# Patient Record
Sex: Male | Born: 1970 | Race: White | Hispanic: No | Smoking: Current every day smoker
Health system: Southern US, Community
[De-identification: ages and names within clinical notes are randomized; demographics above are authoritative.]

## PROBLEM LIST (undated history)

## (undated) DIAGNOSIS — J449 Chronic obstructive pulmonary disease, unspecified: Secondary | ICD-10-CM

## (undated) DIAGNOSIS — I10 Essential (primary) hypertension: Secondary | ICD-10-CM

## (undated) DIAGNOSIS — N200 Calculus of kidney: Secondary | ICD-10-CM

## (undated) DIAGNOSIS — J9383 Other pneumothorax: Secondary | ICD-10-CM

## (undated) HISTORY — PX: LITHOTRIPSY: SUR834

## (undated) HISTORY — PX: LUNG SURGERY: SHX703

## (undated) HISTORY — PX: CYSTOSCOPY: SUR368

## (undated) HISTORY — PX: CHEST TUBE INSERTION: SHX231

---

## 2007-05-31 ENCOUNTER — Emergency Department (HOSPITAL_COMMUNITY): Admission: EM | Admit: 2007-05-31 | Discharge: 2007-06-01 | Payer: Self-pay | Admitting: Emergency Medicine

## 2007-08-06 ENCOUNTER — Emergency Department (HOSPITAL_COMMUNITY): Admission: EM | Admit: 2007-08-06 | Discharge: 2007-08-06 | Payer: Self-pay | Admitting: Emergency Medicine

## 2007-09-25 ENCOUNTER — Emergency Department (HOSPITAL_COMMUNITY): Admission: EM | Admit: 2007-09-25 | Discharge: 2007-09-25 | Payer: Self-pay | Admitting: Emergency Medicine

## 2007-09-27 ENCOUNTER — Emergency Department (HOSPITAL_COMMUNITY): Admission: EM | Admit: 2007-09-27 | Discharge: 2007-09-27 | Payer: Self-pay | Admitting: Emergency Medicine

## 2007-10-06 ENCOUNTER — Emergency Department (HOSPITAL_COMMUNITY): Admission: EM | Admit: 2007-10-06 | Discharge: 2007-10-06 | Payer: Self-pay | Admitting: Emergency Medicine

## 2007-10-21 ENCOUNTER — Emergency Department (HOSPITAL_COMMUNITY): Admission: EM | Admit: 2007-10-21 | Discharge: 2007-10-21 | Payer: Self-pay | Admitting: Emergency Medicine

## 2007-11-16 ENCOUNTER — Emergency Department: Payer: Self-pay | Admitting: Emergency Medicine

## 2007-11-23 ENCOUNTER — Emergency Department: Payer: Self-pay | Admitting: Emergency Medicine

## 2007-11-25 ENCOUNTER — Emergency Department (HOSPITAL_COMMUNITY): Admission: EM | Admit: 2007-11-25 | Discharge: 2007-11-25 | Payer: Self-pay | Admitting: Emergency Medicine

## 2008-01-11 ENCOUNTER — Emergency Department (HOSPITAL_COMMUNITY): Admission: EM | Admit: 2008-01-11 | Discharge: 2008-01-11 | Payer: Self-pay | Admitting: Emergency Medicine

## 2008-07-30 ENCOUNTER — Emergency Department (HOSPITAL_BASED_OUTPATIENT_CLINIC_OR_DEPARTMENT_OTHER): Admission: EM | Admit: 2008-07-30 | Discharge: 2008-07-30 | Payer: Self-pay | Admitting: Emergency Medicine

## 2008-08-23 ENCOUNTER — Ambulatory Visit: Payer: Self-pay | Admitting: Diagnostic Radiology

## 2008-08-23 ENCOUNTER — Emergency Department (HOSPITAL_BASED_OUTPATIENT_CLINIC_OR_DEPARTMENT_OTHER): Admission: EM | Admit: 2008-08-23 | Discharge: 2008-08-23 | Payer: Self-pay | Admitting: Emergency Medicine

## 2008-09-25 ENCOUNTER — Emergency Department (HOSPITAL_BASED_OUTPATIENT_CLINIC_OR_DEPARTMENT_OTHER): Admission: EM | Admit: 2008-09-25 | Discharge: 2008-09-25 | Payer: Self-pay | Admitting: Emergency Medicine

## 2008-11-06 ENCOUNTER — Ambulatory Visit: Payer: Self-pay | Admitting: Diagnostic Radiology

## 2008-11-06 ENCOUNTER — Emergency Department (HOSPITAL_BASED_OUTPATIENT_CLINIC_OR_DEPARTMENT_OTHER): Admission: EM | Admit: 2008-11-06 | Discharge: 2008-11-06 | Payer: Self-pay | Admitting: Emergency Medicine

## 2009-01-04 ENCOUNTER — Emergency Department (HOSPITAL_BASED_OUTPATIENT_CLINIC_OR_DEPARTMENT_OTHER): Admission: EM | Admit: 2009-01-04 | Discharge: 2009-01-04 | Payer: Self-pay | Admitting: Emergency Medicine

## 2009-01-04 ENCOUNTER — Ambulatory Visit: Payer: Self-pay | Admitting: Diagnostic Radiology

## 2009-02-17 ENCOUNTER — Emergency Department (HOSPITAL_BASED_OUTPATIENT_CLINIC_OR_DEPARTMENT_OTHER): Admission: EM | Admit: 2009-02-17 | Discharge: 2009-02-17 | Payer: Self-pay | Admitting: Internal Medicine

## 2009-02-17 ENCOUNTER — Ambulatory Visit: Payer: Self-pay | Admitting: Diagnostic Radiology

## 2009-05-04 ENCOUNTER — Ambulatory Visit: Payer: Self-pay | Admitting: Diagnostic Radiology

## 2009-05-04 ENCOUNTER — Emergency Department (HOSPITAL_BASED_OUTPATIENT_CLINIC_OR_DEPARTMENT_OTHER): Admission: EM | Admit: 2009-05-04 | Discharge: 2009-05-04 | Payer: Self-pay | Admitting: Emergency Medicine

## 2009-07-20 ENCOUNTER — Emergency Department (HOSPITAL_BASED_OUTPATIENT_CLINIC_OR_DEPARTMENT_OTHER): Admission: EM | Admit: 2009-07-20 | Discharge: 2009-07-20 | Payer: Self-pay | Admitting: Emergency Medicine

## 2009-07-20 ENCOUNTER — Ambulatory Visit: Payer: Self-pay | Admitting: Diagnostic Radiology

## 2010-08-11 ENCOUNTER — Emergency Department (INDEPENDENT_AMBULATORY_CARE_PROVIDER_SITE_OTHER)
Admission: EM | Admit: 2010-08-11 | Discharge: 2010-08-11 | Disposition: A | Discharge status: Other Acute Inpatient Hospital | Payer: Self-pay | Source: Home / Self Care | Admitting: Emergency Medicine

## 2010-08-11 ENCOUNTER — Inpatient Hospital Stay (HOSPITAL_COMMUNITY)
Admission: EM | Admit: 2010-08-11 | Discharge: 2010-08-20 | DRG: 165 | Disposition: A | Discharge status: Home / Self Care | Payer: Self-pay | Attending: Cardiothoracic Surgery | Admitting: Cardiothoracic Surgery

## 2010-08-11 DIAGNOSIS — R079 Chest pain, unspecified: Secondary | ICD-10-CM

## 2010-08-11 DIAGNOSIS — M549 Dorsalgia, unspecified: Secondary | ICD-10-CM

## 2010-08-11 DIAGNOSIS — F17200 Nicotine dependence, unspecified, uncomplicated: Secondary | ICD-10-CM | POA: Diagnosis present

## 2010-08-11 DIAGNOSIS — J439 Emphysema, unspecified: Secondary | ICD-10-CM | POA: Diagnosis present

## 2010-08-11 DIAGNOSIS — D72829 Elevated white blood cell count, unspecified: Secondary | ICD-10-CM | POA: Diagnosis present

## 2010-08-11 DIAGNOSIS — J9383 Other pneumothorax: Principal | ICD-10-CM | POA: Diagnosis present

## 2010-08-11 DIAGNOSIS — J9382 Other air leak: Secondary | ICD-10-CM | POA: Diagnosis present

## 2010-08-11 LAB — COMPREHENSIVE METABOLIC PANEL
AST: 16 U/L (ref 0–37)
Albumin: 4.4 g/dL (ref 3.5–5.2)
CO2: 28 mEq/L (ref 19–32)
Calcium: 9.3 mg/dL (ref 8.4–10.5)
Chloride: 108 mEq/L (ref 96–112)
Creatinine, Ser: 0.9 mg/dL (ref 0.4–1.5)
Glucose, Bld: 85 mg/dL (ref 70–99)
Potassium: 3.6 mEq/L (ref 3.5–5.1)

## 2010-08-11 LAB — DIFFERENTIAL
Basophils Absolute: 0.1 10*3/uL (ref 0.0–0.1)
Eosinophils Relative: 3 % (ref 0–5)
Lymphocytes Relative: 22 % (ref 12–46)
Lymphs Abs: 4.1 10*3/uL — ABNORMAL HIGH (ref 0.7–4.0)
Monocytes Absolute: 1.1 10*3/uL — ABNORMAL HIGH (ref 0.1–1.0)
Neutro Abs: 12.8 10*3/uL — ABNORMAL HIGH (ref 1.7–7.7)
Neutrophils Relative %: 68 % (ref 43–77)

## 2010-08-11 LAB — APTT: aPTT: 29 seconds (ref 24–37)

## 2010-08-11 LAB — PROTIME-INR
INR: 1 (ref 0.00–1.49)
Prothrombin Time: 13.4 seconds (ref 11.6–15.2)

## 2010-08-11 LAB — POCT I-STAT 3, ART BLOOD GAS (G3+)
Acid-Base Excess: 1 mmol/L (ref 0.0–2.0)
O2 Saturation: 98 %
TCO2: 29 mmol/L (ref 0–100)

## 2010-08-11 LAB — CBC
HCT: 41.3 % (ref 39.0–52.0)
MCHC: 34.4 g/dL (ref 30.0–36.0)
WBC: 18.7 10*3/uL — ABNORMAL HIGH (ref 4.0–10.5)

## 2010-08-11 LAB — D-DIMER, QUANTITATIVE: D-Dimer, Quant: 0.53 ug/mL-FEU — ABNORMAL HIGH (ref 0.00–0.48)

## 2010-08-13 LAB — CBC
HCT: 39.5 % (ref 39.0–52.0)
Hemoglobin: 12.6 g/dL — ABNORMAL LOW (ref 13.0–17.0)
MCH: 29 pg (ref 26.0–34.0)
MCHC: 31.9 g/dL (ref 30.0–36.0)
MCV: 91 fL (ref 78.0–100.0)
Platelets: 250 10*3/uL (ref 150–400)
RBC: 4.34 MIL/uL (ref 4.22–5.81)
RDW: 13.1 % (ref 11.5–15.5)
WBC: 7.4 10*3/uL (ref 4.0–10.5)

## 2010-08-13 LAB — URINE CULTURE
Colony Count: NO GROWTH
Culture  Setup Time: 201201291714
Culture: NO GROWTH

## 2010-08-13 NOTE — H&P (Signed)
[name], [name]              ACCOUNT NO.:  [id number]  MEDICAL RECORD NO.:  [id number]          PATIENT TYPE:  INP  LOCATION:  2038                         FACILITY:  [facility]  PHYSICIAN:  Kerin Perna, M.D.  DATE OF BIRTH:  09-Apr-1971  DATE OF ADMISSION:  08/11/2010 DATE OF DISCHARGE:                             HISTORY & PHYSICAL   ADMISSION DIAGNOSIS:  Spontaneous left pneumothorax.  CHIEF COMPLAINT:  Shortness of breath, chest and back pain.  HISTORY OF PRESENT ILLNESS:  The patient is a 40 year old male, Caucasian, smoker, who presented to the emergency room with his first episode of spontaneous pneumothorax.  There is no history of trauma, violent cough, or emesis.  The patient has had symptoms of back pain and shortness of breath for approximately 12 hours and presented to the [facility] where a chest x-ray showed a 40% left pneumothorax.  He had evidence of COPD changes as well and a small right less than 10% apical pneumothorax as well.  A 28-French chest tube was placed with re-expansion of the lung with a residual air leak.  The patient denies a productive cough, fever, or recent pneumonia.  He smokes a pack and half of cigarettes a day.  There is no family history of spontaneous pneumothorax.  He has had no previous treatment for spontaneous pneumothorax.  PAST MEDICAL HISTORY: 1. Kidney stones with previous UTI, previous lithotripsy by Dr.     Merry Lofty in [facility] 2. Diverticulosis. 3. COPD, active smoking. 4. Allergic reaction to TORADOL.  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  He is married.  He smokes pack and a half cigarettes a day without significant alcohol intake.  FAMILY HISTORY:  Noncontributory.  REVIEW OF SYSTEMS:  No fever or weight loss.  General strength and energy level have been decreased over the past several weeks.  No orthopnea, PND, ankle edema,  angina, arrhythmia.  No history of cardiac murmur.  GI is significant for previous diverticular disease noted on previous CT scans of the abdomen. No active bowel complaints.  Vascular review is negative for DVT, TIA, or claudication.  Hematologic review is negative for bleeding disorders, blood transfusions.  Endocrine review is negative for diabetes.  Neurologic review is negative for stroke or seizure.  CT scan of the head in 2010 was negative.  PHYSICAL EXAMINATION:  VITAL SIGNS:  The patient is 5 feet 7 inches, weighs 128 pounds.  Blood pressure in the emergency room is 170/110, pulse 108, pulse oxygen saturation 100%, temperature 97.6. GENERAL APPEARANCE:  A middle-aged Caucasian male, uncomfortable, in no acute distress. HEENT:  Normocephalic.  Pupils equal. NECK:  Without crepitus or JVD. THORAX:  Without deformity.  There is a left chest tube in place and secured with an air leak to the under water seal Pleur-evac system. Breath sounds are diminished bilaterally. CARDIAC:  Regular rhythm without gallop or murmur. ABDOMEN:  Soft, nontender. EXTREMITIES:  Mild clubbing.  No cyanosis, edema, or tenderness. Peripheral pulses are intact. NEUROLOGIC:  Intact.  LABORATORY DATA:  His white count is elevated  at 18,000, hematocrit 41. BUN, creatinine normal.  LFTs normal.  Albumin 4.4.  Urinalysis is pending.  His EKG shows sinus tachycardia, and his chest x-ray is noted as above.  IMPRESSION AND PLAN:  First episode of spontaneous left pneumothorax in a heavy smoker.  The patient has an elevated white count and a history of kidney stones.  He possibly could have stress leukocytosis from his pneumothorax.  We will plan on obtaining a chest CT scan with contrast, urine and culture and admit the patient for observation.     Kerin Perna, M.D.     PV/MEDQ  D:  08/11/2010  T:  08/12/2010  Job:  [id number]  Electronically Signed by Kerin Perna M.D. on 08/13/2010 05:21:57  PM

## 2010-08-15 ENCOUNTER — Inpatient Hospital Stay (HOSPITAL_COMMUNITY): Payer: Self-pay

## 2010-08-15 LAB — ABO/RH: ABO/RH(D): A NEG

## 2010-08-15 LAB — TYPE AND SCREEN
ABO/RH(D): A NEG
Antibody Screen: NEGATIVE

## 2010-08-16 ENCOUNTER — Inpatient Hospital Stay (HOSPITAL_COMMUNITY): Payer: Self-pay

## 2010-08-16 ENCOUNTER — Other Ambulatory Visit: Payer: Self-pay | Admitting: Cardiothoracic Surgery

## 2010-08-16 DIAGNOSIS — J930 Spontaneous tension pneumothorax: Secondary | ICD-10-CM

## 2010-08-16 LAB — BLOOD GAS, ARTERIAL
Acid-Base Excess: 6.2 mmol/L — ABNORMAL HIGH (ref 0.0–2.0)
Acid-Base Excess: 7.6 mmol/L — ABNORMAL HIGH (ref 0.0–2.0)
Bicarbonate: 32.4 mEq/L — ABNORMAL HIGH (ref 20.0–24.0)
Bicarbonate: 33.6 mEq/L — ABNORMAL HIGH (ref 20.0–24.0)
Drawn by: 249101
Drawn by: 249101
O2 Content: 4 L/min
O2 Content: 4 L/min
O2 Saturation: 97.5 %
O2 Saturation: 99.1 %
Patient temperature: 97.7
Patient temperature: 98.7
TCO2: 34.5 mmol/L (ref 0–100)
TCO2: 35.6 mmol/L (ref 0–100)
pCO2 arterial: 67.3 mmHg (ref 35.0–45.0)
pCO2 arterial: 66.7 mmHg (ref 35.0–45.0)
pH, Arterial: 7.301 — ABNORMAL LOW (ref 7.350–7.450)
pH, Arterial: 7.323 — ABNORMAL LOW (ref 7.350–7.450)
pO2, Arterial: 104 mmHg — ABNORMAL HIGH (ref 80.0–100.0)
pO2, Arterial: 153 mmHg — ABNORMAL HIGH (ref 80.0–100.0)

## 2010-08-16 LAB — POCT I-STAT 3, ART BLOOD GAS (G3+)
Acid-Base Excess: 5 mmol/L — ABNORMAL HIGH (ref 0.0–2.0)
Bicarbonate: 31.7 mEq/L — ABNORMAL HIGH (ref 20.0–24.0)
O2 Saturation: 100 %
TCO2: 33 mmol/L (ref 0–100)
pCO2 arterial: 53.5 mmHg — ABNORMAL HIGH (ref 35.0–45.0)
pH, Arterial: 7.381 (ref 7.350–7.450)
pO2, Arterial: 200 mmHg — ABNORMAL HIGH (ref 80.0–100.0)

## 2010-08-16 LAB — MRSA PCR SCREENING: MRSA by PCR: NEGATIVE

## 2010-08-17 ENCOUNTER — Inpatient Hospital Stay (HOSPITAL_COMMUNITY): Payer: Self-pay

## 2010-08-17 LAB — POCT I-STAT 3, ART BLOOD GAS (G3+)
Acid-Base Excess: 5 mmol/L — ABNORMAL HIGH (ref 0.0–2.0)
Bicarbonate: 31.7 mEq/L — ABNORMAL HIGH (ref 20.0–24.0)
O2 Saturation: 98 %
Patient temperature: 98
TCO2: 33 mmol/L (ref 0–100)
pCO2 arterial: 50.5 mmHg — ABNORMAL HIGH (ref 35.0–45.0)
pH, Arterial: 7.404 (ref 7.350–7.450)
pO2, Arterial: 102 mmHg — ABNORMAL HIGH (ref 80.0–100.0)

## 2010-08-17 LAB — BASIC METABOLIC PANEL
BUN: 9 mg/dL (ref 6–23)
CO2: 28 mEq/L (ref 19–32)
Calcium: 9.1 mg/dL (ref 8.4–10.5)
Chloride: 101 mEq/L (ref 96–112)
Creatinine, Ser: 0.7 mg/dL (ref 0.4–1.5)
GFR calc Af Amer: >60 mL/min (ref >60)
GFR calc non Af Amer: >60 mL/min (ref >60)
Glucose, Bld: 140 mg/dL — ABNORMAL HIGH (ref 70–99)
Potassium: 4.4 mEq/L (ref 3.5–5.1)
Sodium: 136 mEq/L (ref 135–145)

## 2010-08-17 LAB — CBC
HCT: 38.9 % — ABNORMAL LOW (ref 39.0–52.0)
Hemoglobin: 12.6 g/dL — ABNORMAL LOW (ref 13.0–17.0)
MCH: 29.6 pg (ref 26.0–34.0)
MCHC: 32.4 g/dL (ref 30.0–36.0)
MCV: 91.3 fL (ref 78.0–100.0)
Platelets: 253 10*3/uL (ref 150–400)
RBC: 4.26 MIL/uL (ref 4.22–5.81)
RDW: 13 % (ref 11.5–15.5)
WBC: 12.6 10*3/uL — ABNORMAL HIGH (ref 4.0–10.5)

## 2010-08-18 ENCOUNTER — Inpatient Hospital Stay (HOSPITAL_COMMUNITY): Payer: Self-pay

## 2010-08-18 LAB — COMPREHENSIVE METABOLIC PANEL
ALT: 21 U/L (ref 0–53)
AST: 43 U/L — ABNORMAL HIGH (ref 0–37)
Albumin: 3.6 g/dL (ref 3.5–5.2)
Alkaline Phosphatase: 54 U/L (ref 39–117)
BUN: 11 mg/dL (ref 6–23)
CO2: 29 mEq/L (ref 19–32)
Calcium: 9.7 mg/dL (ref 8.4–10.5)
Chloride: 99 mEq/L (ref 96–112)
Creatinine, Ser: 0.69 mg/dL (ref 0.4–1.5)
GFR calc Af Amer: >60 mL/min (ref >60)
GFR calc non Af Amer: >60 mL/min (ref >60)
Glucose, Bld: 98 mg/dL (ref 70–99)
Potassium: 4.6 mEq/L (ref 3.5–5.1)
Sodium: 138 mEq/L (ref 135–145)
Total Bilirubin: 0.3 mg/dL (ref 0.3–1.2)
Total Protein: 7 g/dL (ref 6.0–8.3)

## 2010-08-18 LAB — CBC
HCT: 40.2 % (ref 39.0–52.0)
Hemoglobin: 13 g/dL (ref 13.0–17.0)
MCH: 29.3 pg (ref 26.0–34.0)
MCHC: 32.3 g/dL (ref 30.0–36.0)
MCV: 90.7 fL (ref 78.0–100.0)
Platelets: 294 10*3/uL (ref 150–400)
RBC: 4.43 MIL/uL (ref 4.22–5.81)
RDW: 13.3 % (ref 11.5–15.5)
WBC: 12.3 10*3/uL — ABNORMAL HIGH (ref 4.0–10.5)

## 2010-08-18 LAB — GLUCOSE, CAPILLARY
Glucose-Capillary: 92 mg/dL (ref 70–99)
Glucose-Capillary: 111 mg/dL — ABNORMAL HIGH (ref 70–99)

## 2010-08-19 ENCOUNTER — Inpatient Hospital Stay (HOSPITAL_COMMUNITY): Payer: Self-pay

## 2010-08-19 LAB — CBC
HCT: 41.8 % (ref 39.0–52.0)
Hemoglobin: 13.4 g/dL (ref 13.0–17.0)
MCH: 29.7 pg (ref 26.0–34.0)
MCHC: 32.1 g/dL (ref 30.0–36.0)
MCV: 92.7 fL (ref 78.0–100.0)
Platelets: 317 10*3/uL (ref 150–400)
RBC: 4.51 MIL/uL (ref 4.22–5.81)
RDW: 13.1 % (ref 11.5–15.5)
WBC: 12.2 10*3/uL — ABNORMAL HIGH (ref 4.0–10.5)

## 2010-08-20 ENCOUNTER — Inpatient Hospital Stay (HOSPITAL_COMMUNITY): Payer: Self-pay

## 2010-08-20 NOTE — Op Note (Signed)
[name], [name]              ACCOUNT NO.:  [id number]  MEDICAL RECORD NO.:  [id number]           PATIENT TYPE:  I  LOCATION:  2304                         FACILITY:  [facility]  PHYSICIAN:  Kerin Perna, M.D.  DATE OF BIRTH:  08-Sep-1970  DATE OF PROCEDURE:  08/16/2010 DATE OF DISCHARGE:                              OPERATIVE REPORT   OPERATION:  Left VATS, left mini thoracotomy with resection of apical blebs and pleurectomy.  PREOPERATIVE DIAGNOSIS:  Spontaneous left pneumothorax with persistent air leak and bullous apical lung disease.  POSTOPERATIVE DIAGNOSIS:  Spontaneous left pneumothorax with persistent air leak and bullous apical lung disease.  SURGEON:  Kerin Perna, MD  ASSISTANT:  Coral Ceo, PA-C  ANESTHESIA:  General by Zenon Mayo, MD  INDICATIONS:  The patient is a 40 year old Caucasian male heavy smoker who presented to an urgent care center with shortness of breath and left chest pain and had a large left pneumothorax.  A chest tube was placed by the emergency department physician and the patient was transferred to [facility] for admission.  Over the next 5 days, he had fairly good re-expansion of the lung, but with persistent air leak.  CT scan showed significant apical cold blood disease bilaterally with chest tube in good position.  Because of the persistent air leak, a VATS procedure with resection of blebs and pleurodesis was recommended as his best option.  I discussed the procedure in detail with the patient and his sister including the use of general anesthesia, the location of the surgical incisions, and expected postoperative recovery.  He understood the risks of recurrent pneumothorax, pneumonia, ventilator dependence, infection, and death.  After reviewing these issues, he agreed to proceed with surgery under what I felt was an informed consent.  OPERATIVE FINDINGS: 1. The patient was a difficult intubation and a  double-lumen     endotracheal tube was not successfully placed after three attempts.     To maintain adequate airway, a single-lumen tube was placed. 2. Due to inability to collapse the left lung during surgery,     attempted videoscopic procedure was not safe successful and small     minithoracotomy was performed. 3. Apical blebs were successfully excised and the upper third of the     parietal pleura was excised with a pleurectomy to prevent further     collapse.  PROCEDURE:  The patient was brought to the operative room, placed supine on the operative table where general anesthesia was induced.  After three attempts to place a double-lumen endotracheal tube were made, a single-lumen tube was placed successfully.  The patient remained stable. The previously placed left chest tube was removed and the patient was turned to a lateral thoracotomy position.  The chest was prepped and draped as a sterile field.  After proper time-out was performed to confirm proper site and proper patient, 2 small VATS portal incisions were made in the posterior and mid axillary line.  The VATS camera and instrumentation were unable to expose the lung due to full ventilation.  The VATS instruments were removed and a minithoracotomy was made in the  fourth interspace.  The ribs were gently retracted and using open instruments, the lung was mobilized.  There were changes of COPD.  There was some medial and apical adhesions, which were taken down so the lung could be delivered in the operative field.  Apical blebs were identified and these were stable and excised with several loads of Endo-GIA surgical stapling device.  The staple lines were covered with Progel medical adhesive.  No other blebs were located in the superior segment of the lower lobe or other areas.  The inferior ligament was mobilized.  The pleurectomy was then performed using visceral pleural dissection and large amount of pleura was removed  and submitted for Pathology.  The very apex of the left hemithorax was pleurodesed with an electrocautery scratch pad to leave an adequate abraded surface on the parietal pleura in that area. The lung was then re-expanded under direct vision.  A 28-French chest tube was placed in a separate incision and directed the apex and secured to the skin.  The incision was then closed.  #2 Vicryls were used to close the ribs in a pericostal technique.  The muscle was closed with interrupted #1 Vicryl in the fascia and skin were closed in running Vicryl.  A On-Q catheter was placed beneath the incision above the chest tube site and connected to a 0.5% Marcaine reservoir and secured to the skin.  The 2 VATS portal incisions were closed in layers using 0-Vicryl.  The original chest tube site was sharply debrided and closed with interrupted 3-0 nylon.  Sterile dressings were placed and the patient was returned to the supine position where he was reversed from anesthesia, extubated, and returned to the recovery room in stable condition.     Kerin Perna, M.D.     PV/MEDQ  D:  08/16/2010  T:  08/17/2010  Job:  [id number]  Electronically Signed by Kerin Perna M.D. on 08/20/2010 11:39:46 AM

## 2010-08-27 NOTE — Discharge Summary (Signed)
Richard [name], Richard [name]              ACCOUNT NO.:  [id number]  MEDICAL RECORD NO.:  [id number]           PATIENT TYPE:  I  LOCATION:  2009                         FACILITY:  [facility]  PHYSICIAN:  Kerin Perna, M.D.  DATE OF BIRTH:  Jan 09, 1971  DATE OF ADMISSION:  08/11/2010 DATE OF DISCHARGE:  08/20/2010                              DISCHARGE SUMMARY   PRIMARY ADMITTING DIAGNOSIS:  Shortness of breath.  ADDITIONAL/DISCHARGE DIAGNOSES: 1. Spontaneous left pneumothorax. 2. Bullous emphysema. 3. Chronic obstructive pulmonary disease. 4. Ongoing tobacco abuse. 5. History of kidney stones. 6. Diverticulosis.  PROCEDURES PERFORMED:  Left VATS, left minithoracotomy with resection of apical blebs and pleurectomy.  HISTORY:  The patient is a 40 year old male with no significant past medical history except tobacco abuse.  He presented to the Urgent Care Center in [facility], complaining of a 12-hour history of shortness of breath with associated back pain.  He was noted to have a 40% left pneumothorax.  He also had evidence of a small, right, less than 10% apical pneumothorax as well as evidence of COPD.  A 28-French chest tube was placed there with re-expansion of the lung and a small residual air leak.  Following placement of the chest tube, Dr. Donata Clay was consulted to admit the patient to [facility] for management of his chest tube.  The patient was brought to the emergency department at [facility] where he was evaluated by Dr. Donata Clay and was subsequently admitted.  HOSPITAL COURSE:  Richard [name] was admitted to Unit [address] and chest tube was placed to suction.  He was also noted to have a low-grade fever and leukocytosis on admission.  Urinalysis and urine culture were negative but the patient was started on empiric antibiotics for presumed pneumonia.  A CT scan was performed which confirmed scarring from COPD as well as apical blebs and small nodules, right greater than  left. Despite adequate chest tube management, the patient continued to have a persistent air leak as well as evidence of pneumothorax on chest x-ray. In light of his CT evidence of bullous emphysema and prolonged air leak, it was felt that he should undergo a left VATS for resection of apical blebs to ultimately manage this issue.  All risks, benefits, and alternatives of surgery were explained to the patient and he agreed to proceed.  He was taken to the operating room on August 16, 2010, and underwent the above-described procedure.  Please see previously dictated operative report for complete details of surgery.  He tolerated the procedure well and was transferred to the SICU in stable condition.  He was monitored closely for 24 hours in the ICU and was subsequently transferred to the floor in stable condition on postop day #2.  Overall, postoperative course has been uneventful.  His air leak slowly resolved and chest x-rays have remained stable.  Chest tube was discontinued on postop day #3.  A followup chest x-ray shows no evidence of residual pneumothorax.  His incisions were all healing well.  He has remained afebrile and vital signs have been stable.  He was ambulating in the halls without difficulty.  He has been counseled regarding smoking cessation and was started on nicotine patch while in the hospital.  He was tolerating a regular diet and was having normal bowel and bladder function.  He has been seen and evaluated on postop day #4 by Dr. Donata Clay and was deemed ready for discharge home at this time.  DISCHARGE MEDICATIONS:   1. Percocet 1-2 q.3-4 hours p.r.n. for pain. 2. NicoDerm CQ 14-mg patch daily. 3. Advair 250/50 one puff b.i.d.  DISCHARGE INSTRUCTIONS:  He was asked to refrain from driving, heavy lifting, or strenuous activity.  He may continue ambulating daily and using his incentive spirometer.  He may shower daily and clean his incisions with soap and water.   He will continue same preoperative diet.  DISCHARGE FOLLOWUP:  He will see Dr. Donata Clay back in the office in 1 week with a chest x-ray.  He will contact our office in the interim if he experiences any problems or has questions.     Coral Ceo, P.A.   ______________________________ Kerin Perna, M.D.    GC/MEDQ  D:  08/20/2010  T:  08/20/2010  Job:  [id number]  cc:   [facility] Office  Electronically Signed by Coral Ceo P.A. on 08/24/2010 09:39:59 AM Electronically Signed by Kerin Perna M.D. on 08/27/2010 12:48:14 PM

## 2010-08-28 ENCOUNTER — Other Ambulatory Visit: Payer: Self-pay | Admitting: Cardiothoracic Surgery

## 2010-08-28 DIAGNOSIS — D381 Neoplasm of uncertain behavior of trachea, bronchus and lung: Secondary | ICD-10-CM

## 2010-08-29 ENCOUNTER — Ambulatory Visit
Admission: RE | Admit: 2010-08-29 | Discharge: 2010-08-29 | Disposition: A | Discharge status: Home / Self Care | Payer: No Typology Code available for payment source | Source: Ambulatory Visit | Attending: Cardiothoracic Surgery | Admitting: Cardiothoracic Surgery

## 2010-08-29 ENCOUNTER — Ambulatory Visit (INDEPENDENT_AMBULATORY_CARE_PROVIDER_SITE_OTHER): Payer: Self-pay | Admitting: Cardiothoracic Surgery

## 2010-08-29 DIAGNOSIS — J930 Spontaneous tension pneumothorax: Secondary | ICD-10-CM

## 2010-08-29 DIAGNOSIS — D381 Neoplasm of uncertain behavior of trachea, bronchus and lung: Secondary | ICD-10-CM

## 2010-08-30 NOTE — Assessment & Plan Note (Signed)
OFFICE VISIT  Richard [name], Richard [name] DOB:  [birth year]                                        August 30, 2010 CHART #:  [id number]  PROBLEMS: 1. Recurrent spontaneous left pneumothorax status post VATS, stapling     of blebs and pleurodesis on August 16, 2010. 2. Significant COPD with bullous emphysema with bilateral involvement.  PRESENT ILLNESS:  Richard [name] is a 41 year old Caucasian male ex-smoker who returns for his first office visit after undergoing a left VATS, blebectomy, and pleurodesis approximately 2 weeks ago.  He has stopped smoking.  He denies shortness of breath.  He still has post thoracotomy pain and has been taking Lortab at home on a office refill after his Percocet ran out.  He is using Advair inhaler.  The incisions are healing well.  PHYSICAL EXAMINATION:  Blood pressure 120/80, pulse 80 and regular, saturation on room air 99%.  The incisions appeared to be healing.  The chest tube sutures are removed.  Breath sounds are slightly diminished on the left.  Cardiac, rhythm is regular.  A PA and lateral chest x-ray shows increased left apical space consistent with a pneumothorax of approximately 20%.  He had minimal pneumothorax after observation for 36 hours following chest tube removal in the hospital.  However, this was asymptomatic and should slowly improve and with the pleurodesis, would not warrant a chest tube.  He will return for a followup chest x-ray at the clinic in approximately 1 week.  I have provided him with new prescription for Percocet 40 tablets which I told he needed to make last until he returns.  Otherwise, I told him he could drive, otherwise no lifting more than 5-10 pounds.  Kerin Perna, M.D. Electronically Signed  PV/MEDQ  D:  08/30/2010  T:  08/30/2010  Job:  [id number]

## 2010-09-05 ENCOUNTER — Ambulatory Visit: Payer: Self-pay

## 2010-09-12 ENCOUNTER — Encounter (INDEPENDENT_AMBULATORY_CARE_PROVIDER_SITE_OTHER): Payer: Self-pay

## 2010-09-12 DIAGNOSIS — J930 Spontaneous tension pneumothorax: Secondary | ICD-10-CM

## 2010-09-19 NOTE — Assessment & Plan Note (Signed)
[facility] OFFICE VISIT  [name], Richard Nicandro DOB:  08/29/1970                                        September 12, 2010 CHART #:  [id number]  We saw the patient in the office today following his VATS for recurrent pneumothorax.  The patient's chest x-ray is much improved and is only showing a very small apical left pneumothorax.  The patient is recovering well, although he continues to complain of incisional discomfort.  His incisions are well healed, and he is feeling well otherwise.  He was given a prescription for some additional pain medication.  The patient will return to see Dr. Donata Clay in 4 weeks in the [facility] office with a repeat chest x-ray.  Kerin Perna, M.D. Electronically Signed  BC/MEDQ  D:  09/12/2010  T:  09/13/2010  Job:  [id number]

## 2010-09-30 LAB — URINALYSIS, ROUTINE W REFLEX MICROSCOPIC
Bilirubin Urine: NEGATIVE
Ketones, ur: NEGATIVE mg/dL
Nitrite: NEGATIVE
Protein, ur: NEGATIVE mg/dL
pH: 6 (ref 5.0–8.0)

## 2010-10-09 ENCOUNTER — Other Ambulatory Visit: Payer: Self-pay | Admitting: Thoracic Surgery

## 2010-10-09 DIAGNOSIS — D381 Neoplasm of uncertain behavior of trachea, bronchus and lung: Secondary | ICD-10-CM

## 2010-10-10 ENCOUNTER — Ambulatory Visit: Payer: Self-pay | Admitting: Cardiothoracic Surgery

## 2010-10-16 ENCOUNTER — Other Ambulatory Visit: Payer: Self-pay | Admitting: Cardiothoracic Surgery

## 2010-10-16 DIAGNOSIS — D381 Neoplasm of uncertain behavior of trachea, bronchus and lung: Secondary | ICD-10-CM

## 2010-10-17 ENCOUNTER — Ambulatory Visit: Payer: Self-pay | Admitting: Cardiothoracic Surgery

## 2010-10-18 LAB — URINALYSIS, ROUTINE W REFLEX MICROSCOPIC
Bilirubin Urine: NEGATIVE
Hgb urine dipstick: NEGATIVE
Ketones, ur: NEGATIVE mg/dL
Nitrite: NEGATIVE
Specific Gravity, Urine: 1.03 (ref 1.005–1.030)
Urobilinogen, UA: 1 mg/dL (ref 0.0–1.0)

## 2010-10-20 LAB — COMPREHENSIVE METABOLIC PANEL
ALT: <6 U/L (ref 0–53)
AST: 21 U/L (ref 0–37)
Albumin: 4.6 g/dL (ref 3.5–5.2)
Alkaline Phosphatase: 74 U/L (ref 39–117)
Chloride: 104 mEq/L (ref 96–112)
Potassium: 4.4 mEq/L (ref 3.5–5.1)
Sodium: 147 mEq/L — ABNORMAL HIGH (ref 135–145)
Total Protein: 8.2 g/dL (ref 6.0–8.3)

## 2010-10-20 LAB — URINALYSIS, ROUTINE W REFLEX MICROSCOPIC
Bilirubin Urine: NEGATIVE
Glucose, UA: NEGATIVE mg/dL
Hgb urine dipstick: NEGATIVE
Protein, ur: NEGATIVE mg/dL
Specific Gravity, Urine: 1.031 — ABNORMAL HIGH (ref 1.005–1.030)

## 2010-10-20 LAB — DIFFERENTIAL
Basophils Relative: 1 % (ref 0–1)
Eosinophils Absolute: 0.3 10*3/uL (ref 0.0–0.7)
Eosinophils Relative: 4 % (ref 0–5)
Monocytes Absolute: 0.4 10*3/uL (ref 0.1–1.0)
Monocytes Relative: 4 % (ref 3–12)

## 2010-10-20 LAB — CBC
Platelets: 263 10*3/uL (ref 150–400)
RDW: 13 % (ref 11.5–15.5)
WBC: 8.9 10*3/uL (ref 4.0–10.5)

## 2010-10-20 LAB — URINE MICROSCOPIC-ADD ON

## 2010-10-24 ENCOUNTER — Ambulatory Visit: Payer: Self-pay | Admitting: Cardiothoracic Surgery

## 2010-10-30 ENCOUNTER — Other Ambulatory Visit: Payer: Self-pay | Admitting: Cardiothoracic Surgery

## 2010-10-30 DIAGNOSIS — D381 Neoplasm of uncertain behavior of trachea, bronchus and lung: Secondary | ICD-10-CM

## 2010-10-30 LAB — URINALYSIS, ROUTINE W REFLEX MICROSCOPIC
Bilirubin Urine: NEGATIVE
Glucose, UA: NEGATIVE mg/dL
Ketones, ur: NEGATIVE mg/dL
Leukocytes, UA: NEGATIVE
Nitrite: NEGATIVE
Protein, ur: NEGATIVE mg/dL
Specific Gravity, Urine: 1.007 (ref 1.005–1.030)
Urobilinogen, UA: 0.2 mg/dL (ref 0.0–1.0)
pH: 6.5 (ref 5.0–8.0)

## 2010-10-30 LAB — URINE MICROSCOPIC-ADD ON

## 2010-10-30 LAB — BASIC METABOLIC PANEL
BUN: 9 mg/dL (ref 6–23)
Creatinine, Ser: 0.7 mg/dL (ref 0.4–1.5)
GFR calc non Af Amer: >60 mL/min (ref >60)
Glucose, Bld: 79 mg/dL (ref 70–99)
Potassium: 4.2 mEq/L (ref 3.5–5.1)

## 2010-10-30 LAB — BASIC METABOLIC PANEL WITH GFR
CO2: 30 meq/L (ref 19–32)
Calcium: 9.1 mg/dL (ref 8.4–10.5)
Chloride: 105 meq/L (ref 96–112)
GFR calc Af Amer: >60 mL/min (ref >60)
Sodium: 143 meq/L (ref 135–145)

## 2010-10-31 ENCOUNTER — Ambulatory Visit: Payer: Self-pay | Admitting: Cardiothoracic Surgery

## 2010-11-06 ENCOUNTER — Other Ambulatory Visit: Payer: Self-pay | Admitting: Cardiothoracic Surgery

## 2010-11-06 ENCOUNTER — Other Ambulatory Visit: Payer: Self-pay

## 2010-11-06 DIAGNOSIS — J930 Spontaneous tension pneumothorax: Secondary | ICD-10-CM

## 2010-11-07 ENCOUNTER — Ambulatory Visit: Payer: Self-pay | Admitting: Cardiothoracic Surgery

## 2010-12-02 ENCOUNTER — Emergency Department (HOSPITAL_BASED_OUTPATIENT_CLINIC_OR_DEPARTMENT_OTHER): Payer: Self-pay

## 2010-12-02 ENCOUNTER — Emergency Department (HOSPITAL_BASED_OUTPATIENT_CLINIC_OR_DEPARTMENT_OTHER)
Admission: EM | Admit: 2010-12-02 | Discharge: 2010-12-02 | Disposition: A | Discharge status: Home / Self Care | Payer: Self-pay | Attending: Emergency Medicine | Admitting: Emergency Medicine

## 2010-12-02 ENCOUNTER — Emergency Department (INDEPENDENT_AMBULATORY_CARE_PROVIDER_SITE_OTHER): Payer: Self-pay

## 2010-12-02 DIAGNOSIS — F17200 Nicotine dependence, unspecified, uncomplicated: Secondary | ICD-10-CM | POA: Insufficient documentation

## 2010-12-02 DIAGNOSIS — R079 Chest pain, unspecified: Secondary | ICD-10-CM

## 2010-12-02 DIAGNOSIS — R05 Cough: Secondary | ICD-10-CM

## 2010-12-02 DIAGNOSIS — J438 Other emphysema: Secondary | ICD-10-CM

## 2010-12-02 DIAGNOSIS — J449 Chronic obstructive pulmonary disease, unspecified: Secondary | ICD-10-CM | POA: Insufficient documentation

## 2010-12-02 DIAGNOSIS — R071 Chest pain on breathing: Secondary | ICD-10-CM | POA: Insufficient documentation

## 2010-12-02 DIAGNOSIS — J4489 Other specified chronic obstructive pulmonary disease: Secondary | ICD-10-CM | POA: Insufficient documentation

## 2010-12-02 LAB — CBC
HCT: 39.7 % (ref 39.0–52.0)
RBC: 4.51 MIL/uL (ref 4.22–5.81)
RDW: 12.8 % (ref 11.5–15.5)
WBC: 11.3 10*3/uL — ABNORMAL HIGH (ref 4.0–10.5)

## 2010-12-02 LAB — COMPREHENSIVE METABOLIC PANEL
ALT: <5 U/L (ref 0–53)
Albumin: 3.9 g/dL (ref 3.5–5.2)
Alkaline Phosphatase: 71 U/L (ref 39–117)
Chloride: 102 mEq/L (ref 96–112)
Glucose, Bld: 101 mg/dL — ABNORMAL HIGH (ref 70–99)
Potassium: 4 mEq/L (ref 3.5–5.1)
Sodium: 141 mEq/L (ref 135–145)
Total Bilirubin: 0.1 mg/dL — ABNORMAL LOW (ref 0.3–1.2)
Total Protein: 7.6 g/dL (ref 6.0–8.3)

## 2010-12-02 LAB — CK TOTAL AND CKMB (NOT AT ARMC): Total CK: 105 U/L (ref 7–232)

## 2010-12-02 MED ORDER — IOHEXOL 350 MG/ML SOLN
100.0000 mL | Freq: Once | INTRAVENOUS | Status: AC | PRN
  Administered 2010-12-02: 100 mL via INTRAVENOUS

## 2010-12-04 ENCOUNTER — Other Ambulatory Visit: Payer: Self-pay | Admitting: Cardiothoracic Surgery

## 2010-12-04 DIAGNOSIS — D381 Neoplasm of uncertain behavior of trachea, bronchus and lung: Secondary | ICD-10-CM

## 2010-12-05 ENCOUNTER — Ambulatory Visit: Payer: Self-pay | Admitting: Cardiothoracic Surgery

## 2010-12-31 DIAGNOSIS — J930 Spontaneous tension pneumothorax: Secondary | ICD-10-CM

## 2011-01-02 DIAGNOSIS — J930 Spontaneous tension pneumothorax: Secondary | ICD-10-CM

## 2011-01-04 DIAGNOSIS — J930 Spontaneous tension pneumothorax: Secondary | ICD-10-CM

## 2011-01-23 ENCOUNTER — Encounter (INDEPENDENT_AMBULATORY_CARE_PROVIDER_SITE_OTHER): Payer: Self-pay

## 2011-01-23 DIAGNOSIS — J930 Spontaneous tension pneumothorax: Secondary | ICD-10-CM

## 2011-01-24 NOTE — Assessment & Plan Note (Signed)
[facility] OFFICE VISIT  Jeffory, Snelgrove Colyn DOB:  [birth year]                                        January 24, 2011 CHART #:  [id number]  PROBLEMS: 1. Recurrent left spontaneous pneumothorax. 2. Status post left VATS with stapling of blebs and pleurectomy in     February 2012. 3. Status post left chest tube placement for basilar pneumothorax in     June 2012. 4. Severe chronic obstructive pulmonary disease with bullous emphysema     and heavy tobacco use.  PRESENT ILLNESS:  The patient is a 40 year old Caucasian male smoker who returns to the office after being hospitalized for a recurrent left spontaneous pneumothorax treated with a chest tube with re-expansion of the lung.  This hospitalization was at [facility].  His previous VATS, bleb resection and pleurectomy was performed at [facility].  He is having some incisional pain still from his operation 5 months ago. He presents today for suture removal of the chest tube site and examined chest x-ray review.  He still smoking occasionally.  PHYSICAL EXAMINATION:  His vital signs were stable.  Breath sounds are clear and equal.  The chest tube site is healing and the chest tube sutures were removed and a dressing was applied.  His cardiac rhythm is regular.  PA and lateral chest x-ray shows significant bilateral bullous emphysema.  No pneumothorax.  IMPRESSION AND PLAN:  The patient will return as needed.  He was encouraged to stop smoking completely.  He knows she should not lift heavy weights more than 20 pounds for the next 2 weeks.  He was given one more prescription for Vicodin 40 tablets for his post thoracotomy, post chest tube pain.  He does not have a primary care physician, but gets some medical care from the [facility].  Kerin Perna, M.D. Electronically Signed  PV/MEDQ  D:  01/24/2011  T:  01/24/2011  Job:  [id number]

## 2011-03-11 ENCOUNTER — Encounter: Payer: Self-pay | Admitting: *Deleted

## 2011-03-11 ENCOUNTER — Emergency Department (INDEPENDENT_AMBULATORY_CARE_PROVIDER_SITE_OTHER): Payer: Self-pay

## 2011-03-11 ENCOUNTER — Other Ambulatory Visit: Payer: Self-pay

## 2011-03-11 ENCOUNTER — Emergency Department (HOSPITAL_BASED_OUTPATIENT_CLINIC_OR_DEPARTMENT_OTHER)
Admission: EM | Admit: 2011-03-11 | Discharge: 2011-03-11 | Disposition: A | Discharge status: Home / Self Care | Payer: Self-pay | Attending: Emergency Medicine | Admitting: Emergency Medicine

## 2011-03-11 DIAGNOSIS — R071 Chest pain on breathing: Secondary | ICD-10-CM | POA: Insufficient documentation

## 2011-03-11 DIAGNOSIS — R0602 Shortness of breath: Secondary | ICD-10-CM

## 2011-03-11 DIAGNOSIS — R079 Chest pain, unspecified: Secondary | ICD-10-CM | POA: Insufficient documentation

## 2011-03-11 DIAGNOSIS — R0789 Other chest pain: Secondary | ICD-10-CM

## 2011-03-11 DIAGNOSIS — J984 Other disorders of lung: Secondary | ICD-10-CM | POA: Insufficient documentation

## 2011-03-11 DIAGNOSIS — F17200 Nicotine dependence, unspecified, uncomplicated: Secondary | ICD-10-CM | POA: Insufficient documentation

## 2011-03-11 HISTORY — DX: Other pneumothorax: J93.83

## 2011-03-11 HISTORY — DX: Calculus of kidney: N20.0

## 2011-03-11 LAB — CBC
HCT: 40.8 % (ref 39.0–52.0)
Hemoglobin: 13.7 g/dL (ref 13.0–17.0)
MCH: 29.2 pg (ref 26.0–34.0)
MCHC: 33.6 g/dL (ref 30.0–36.0)
MCV: 87 fL (ref 78.0–100.0)

## 2011-03-11 LAB — CARDIAC PANEL(CRET KIN+CKTOT+MB+TROPI)
CK, MB: 0.8 ng/mL (ref 0.3–4.0)
Relative Index: INVALID (ref 0.0–2.5)
Total CK: 68 U/L (ref 7–232)

## 2011-03-11 LAB — DIFFERENTIAL
Basophils Relative: 1 % (ref 0–1)
Eosinophils Absolute: 0.5 10*3/uL (ref 0.0–0.7)
Monocytes Absolute: 0.7 10*3/uL (ref 0.1–1.0)
Monocytes Relative: 7 % (ref 3–12)
Neutro Abs: 4.7 10*3/uL (ref 1.7–7.7)

## 2011-03-11 LAB — COMPREHENSIVE METABOLIC PANEL
ALT: 6 U/L (ref 0–53)
AST: 12 U/L (ref 0–37)
Albumin: 3.8 g/dL (ref 3.5–5.2)
Calcium: 9.1 mg/dL (ref 8.4–10.5)
Sodium: 141 mEq/L (ref 135–145)
Total Protein: 7.2 g/dL (ref 6.0–8.3)

## 2011-03-11 MED ORDER — MORPHINE SULFATE 2 MG/ML IJ SOLN
2.0000 mg | Freq: Once | INTRAMUSCULAR | Status: AC
  Administered 2011-03-11: 2 mg via INTRAVENOUS
  Filled 2011-03-11: qty 1

## 2011-03-11 MED ORDER — IOHEXOL 350 MG/ML SOLN
80.0000 mL | Freq: Once | INTRAVENOUS | Status: AC | PRN
  Administered 2011-03-11: 80 mL via INTRAVENOUS

## 2011-03-11 MED ORDER — DIAZEPAM 5 MG PO TABS: 5.0000 mg | ORAL_TABLET | Freq: Three times a day (TID) | ORAL | Status: AC | PRN

## 2011-03-11 MED ORDER — TRAMADOL HCL 50 MG PO TABS: 50.0000 mg | ORAL_TABLET | Freq: Four times a day (QID) | ORAL | Status: AC | PRN

## 2011-03-11 MED ORDER — IBUPROFEN 800 MG PO TABS: 800.0000 mg | ORAL_TABLET | Freq: Three times a day (TID) | ORAL | Status: AC

## 2011-03-11 NOTE — ED Provider Notes (Signed)
History     CSN: [id number] Arrival date & time: 03/11/2011  2:07 AM  Chief Complaint  Patient presents with  . Shortness of Breath  . Chest Pain   HPI  40 year old gentleman presents to the emergency department from home with complaint of left lower chest pain. Patient reports he was feeling a little sore earlier, and then the pain has gotten steadily worse since onset. He reports palpation over his left lower rib cage causes severe pain, trying to take deep breaths causes pain. Pain is sharp, severe. 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 Last episode was in May. Patient reports symptoms are slightly similar to his prior pneumothorax. Patient denies any radiation of the pain, no palpitations, no nausea no diaphoresis. Patient denies previous history of cardiac problems. Patient is a daily smoker. Patient denies any recent trauma to the area, no rash. Patient reports he was helping a friend lift a refrigerator yesterday, but did not think he had overly exerted himself.  Past Medical History  Diagnosis Date  . Spontaneous pneumothorax   . Kidney calculi     History reviewed. No pertinent past surgical history.  History reviewed. No pertinent family history.  History  Substance Use Topics  . Smoking status: Current Everyday Smoker  . Smokeless tobacco: Not on file  . Alcohol Use: No      Review of Systems  Constitutional: Negative.   HENT: Negative.   Eyes: Negative.   Respiratory: Positive for chest tightness and shortness of breath. Negative for wheezing and stridor.   Cardiovascular: Positive for chest pain. Negative for palpitations and leg swelling.  Gastrointestinal: Negative.   Musculoskeletal: Negative.   Neurological: Negative.   Hematological: Negative.   Psychiatric/Behavioral: Negative.     Physical Exam  BP 148/97  Pulse 85  Temp(Src) 98.3 F (36.8 C) (Oral)  Resp 16  SpO2  100%  Physical Exam  Constitutional: He is oriented to person, place, and time.       Patient is a disheveled underweight-appearing gentleman in moderate distress  HENT:  Head: Normocephalic and atraumatic.  Mouth/Throat: No oropharyngeal exudate.  Eyes: Conjunctivae and EOM are normal. Pupils are equal, round, and reactive to light.  Neck: Normal range of motion. Neck supple. No JVD present. No tracheal deviation present. No thyromegaly present.  Cardiovascular: Normal rate, regular rhythm, normal heart sounds and intact distal pulses.  Exam reveals no gallop and no friction rub.   No murmur heard. Pulmonary/Chest: Breath sounds normal. No stridor. No respiratory distress. He has no wheezes. He exhibits tenderness.       Patient with significant chest wall tenderness with palpation of left anterior lower chest wall. No crepitus, no deformity noted no rash. Patient's left mid axillary chest noted to have several surgical scars from thoracotomy and chest tubes. No pain with palpation over surgical scars. No significant soft tissue swelling noted  Musculoskeletal: Normal range of motion. He exhibits no edema and no tenderness.  Lymphadenopathy:    He has no cervical adenopathy.  Neurological: He is alert and oriented to person, place, and time.  Skin: Skin is warm and dry. No rash noted. He is not diaphoretic. No erythema. No pallor.    ED Course  Procedures Results for orders placed during the hospital encounter of 03/11/11  CARDIAC PANEL(CRET KIN+CKTOT+MB+TROPI)      Component Value Range   Total CK 68  7 - 232 (U/L)   CK, MB 0.8  0.3 - 4.0 (ng/mL)   Troponin I <0.30  <0.30 (ng/mL)   Relative Index RELATIVE INDEX IS INVALID  0.0 - 2.5   COMPREHENSIVE METABOLIC PANEL      Component Value Range   Sodium 141  135 - 145 (mEq/L)   Potassium 3.8  3.5 - 5.1 (mEq/L)   Chloride 104  96 - 112 (mEq/L)   CO2 28  19 - 32 (mEq/L)   Glucose, Bld 90  70 - 99 (mg/dL)   BUN 12  6 - 23 (mg/dL)    Creatinine, Ser 1.61  0.50 - 1.35 (mg/dL)   Calcium 9.1  8.4 - 09.6 (mg/dL)   Total Protein 7.2  6.0 - 8.3 (g/dL)   Albumin 3.8  3.5 - 5.2 (g/dL)   AST 12  0 - 37 (U/L)   ALT 6  0 - 53 (U/L)   Alkaline Phosphatase 61  39 - 117 (U/L)   Total Bilirubin 0.2 (*) 0.3 - 1.2 (mg/dL)   GFR calc non Af Amer >60  >60 (mL/min)   GFR calc Af Amer >60  >60 (mL/min)  CBC      Component Value Range   WBC 9.5  4.0 - 10.5 (K/uL)   RBC 4.69  4.22 - 5.81 (MIL/uL)   Hemoglobin 13.7  13.0 - 17.0 (g/dL)   HCT 04.5  40.9 - 81.1 (%)   MCV 87.0  78.0 - 100.0 (fL)   MCH 29.2  26.0 - 34.0 (pg)   MCHC 33.6  30.0 - 36.0 (g/dL)   RDW 91.4  78.2 - 95.6 (%)   Platelets 296  150 - 400 (K/uL)  DIFFERENTIAL      Component Value Range   Neutrophils Relative 49  43 - 77 (%)   Neutro Abs 4.7  1.7 - 7.7 (K/uL)   Lymphocytes Relative 37  12 - 46 (%)   Lymphs Abs 3.5  0.7 - 4.0 (K/uL)   Monocytes Relative 7  3 - 12 (%)   Monocytes Absolute 0.7  0.1 - 1.0 (K/uL)   Eosinophils Relative 5  0 - 5 (%)   Eosinophils Absolute 0.5  0.0 - 0.7 (K/uL)   Basophils Relative 1  0 - 1 (%)   Basophils Absolute 0.1  0.0 - 0.1 (K/uL)   Dg Chest 2 View  03/11/2011  *RADIOLOGY REPORT*  Clinical Data: Left-sided chest pain, shortness of breath.  CHEST - 2 VIEW  Comparison: 12/02/2010 CT  Findings: Hyperinflation with biapical bullous changes.  Right greater than left apical scarring as well.  No definite evidence for acute process.  Cardiomediastinal contours are within normal limits.  No osseous abnormality identified.  IMPRESSION: Emphysematous changes without definite evidence for acute process.  Original Report Authenticated By: Waneta Martins, M.D.   Ct Angio Chest W/cm &/or Wo Cm  03/11/2011  *RADIOLOGY REPORT*  Clinical Data:  Shortness of breath, chest pain.  CT ANGIOGRAPHY CHEST WITH CONTRAST  Technique:  Multidetector CT imaging of the chest was performed using the standard protocol during bolus administration of intravenous  contrast.  Multiplanar CT image reconstructions including MIPs were obtained to evaluate the vascular anatomy.  Contrast:  80 ml Omnipaque 350  Comparison:  03/11/2011 radiograph, 12/02/2010 CT  Findings:  Pulmonary vasculature is well opacified.  No filling defects to suggest pulmonary embolism.  The aorta is of normal caliber.  No dissection.  Heart size is normal.  No pericardial effusion.  No pleural effusion.  No lymphadenopathy.  Bilateral, apex predominant emphysematous  changes and scarring. Prior segmentectomy with suture noted at the left lung apex.  No pneumothorax.  Mild bibasilar interlobular septal thickening / ground-glass opacity, similar to prior, may represent early fibrotic changes.  There is a 7 mm left lower lobe nodule which is unchanged in the interval (image 99). Other smaller nodules are also unchanged.  Central airways are patent.  Limited images through the upper abdomen demonstrate nonobstructing left renal stones, incompletely imaged.  No acute abnormality.  No aggressive osseous lesions.  Review of the MIP images confirms the above findings.  IMPRESSION: No pulmonary embolism or acute intrathoracic process.  Advanced emphysematous changes and biapical scarring/pleural thickening, similar to prior.  7 mm left lower lobe pulmonary nodule, unchanged since 08/11/2010. Per [employer] recommendation, a follow-up chest CT 1 year after the initial study (January 2013) recommend.  Nonobstructing left renal stones.  Original Report Authenticated By: Waneta Martins, M.D.    Date: 03/11/2011  Rate: 84  Rhythm: normal sinus rhythm  QRS Axis: normal  Intervals: normal  ST/T Wave abnormalities: normal  Conduction Disutrbances:none  Narrative Interpretation:  LVH  Old EKG Reviewed: none available   MDM 40 year old gentleman with 2 prior episodes of pneumothorax with severe left chest wall pain. No recurrent pneumothorax seen on chest x-ray, will get CT imaging of chest to rule  out PE also look for out maladies in the chest wall. Will treat with pain medicine. Workup otherwise unremarkable. Do not feel symptoms are secondary to aortic dissection, unstable angina, acute coronary syndrome. Suspect musculoskeletal in origin and if workup negative will treat with NSAIDs and Valium for muscle pain      Olivia Mackie, MD 03/11/11 [phone]

## 2011-03-11 NOTE — ED Notes (Signed)
Pt states that he has a hx of left lung collapse and that he began having left CP and SOB yesterday am pain and SOB progressively worsened pt states that sx are similar to past

## 2011-04-05 LAB — I-STAT 8, (EC8 V) (CONVERTED LAB)
Bicarbonate: 23.9
HCT: 47
Potassium: 4
TCO2: 25
pCO2, Ven: 34.5 — ABNORMAL LOW
pH, Ven: 7.447 — ABNORMAL HIGH

## 2011-04-05 LAB — CBC
Hemoglobin: 14.7
MCHC: 33.8
RDW: 12.9

## 2011-04-05 LAB — URINALYSIS, ROUTINE W REFLEX MICROSCOPIC
Bilirubin Urine: NEGATIVE
Glucose, UA: NEGATIVE
Hgb urine dipstick: NEGATIVE
Specific Gravity, Urine: 1.013
pH: 6.5

## 2011-04-05 LAB — DIFFERENTIAL
Basophils Absolute: 0.1
Basophils Relative: 1
Monocytes Absolute: 0.5
Neutro Abs: 6.3

## 2011-04-05 LAB — POCT I-STAT CREATININE: Operator id: 196461

## 2011-06-10 ENCOUNTER — Emergency Department (HOSPITAL_BASED_OUTPATIENT_CLINIC_OR_DEPARTMENT_OTHER)
Admission: EM | Admit: 2011-06-10 | Discharge: 2011-06-10 | Disposition: A | Discharge status: Home / Self Care | Payer: Self-pay | Attending: Emergency Medicine | Admitting: Emergency Medicine

## 2011-06-10 ENCOUNTER — Encounter (HOSPITAL_BASED_OUTPATIENT_CLINIC_OR_DEPARTMENT_OTHER): Payer: Self-pay | Admitting: *Deleted

## 2011-06-10 ENCOUNTER — Emergency Department (INDEPENDENT_AMBULATORY_CARE_PROVIDER_SITE_OTHER): Payer: Self-pay

## 2011-06-10 DIAGNOSIS — J449 Chronic obstructive pulmonary disease, unspecified: Secondary | ICD-10-CM | POA: Insufficient documentation

## 2011-06-10 DIAGNOSIS — N200 Calculus of kidney: Secondary | ICD-10-CM

## 2011-06-10 DIAGNOSIS — R109 Unspecified abdominal pain: Secondary | ICD-10-CM

## 2011-06-10 DIAGNOSIS — J4489 Other specified chronic obstructive pulmonary disease: Secondary | ICD-10-CM | POA: Insufficient documentation

## 2011-06-10 DIAGNOSIS — N201 Calculus of ureter: Secondary | ICD-10-CM | POA: Insufficient documentation

## 2011-06-10 HISTORY — DX: Chronic obstructive pulmonary disease, unspecified: J44.9

## 2011-06-10 LAB — URINE MICROSCOPIC-ADD ON

## 2011-06-10 LAB — URINALYSIS, ROUTINE W REFLEX MICROSCOPIC
Glucose, UA: NEGATIVE mg/dL
Ketones, ur: NEGATIVE mg/dL
Leukocytes, UA: NEGATIVE
Specific Gravity, Urine: 1.006 (ref 1.005–1.030)
pH: 6.5 (ref 5.0–8.0)

## 2011-06-10 MED ORDER — ONDANSETRON HCL 4 MG PO TABS: 4.0000 mg | ORAL_TABLET | Freq: Four times a day (QID) | ORAL | Status: AC

## 2011-06-10 MED ORDER — HYDROMORPHONE HCL PF 1 MG/ML IJ SOLN
1.0000 mg | Freq: Once | INTRAMUSCULAR | Status: AC
  Administered 2011-06-10: 1 mg via INTRAMUSCULAR
  Filled 2011-06-10: qty 1

## 2011-06-10 MED ORDER — OXYCODONE-ACETAMINOPHEN 5-325 MG PO TABS: 2.0000 | ORAL_TABLET | ORAL | Status: AC | PRN

## 2011-06-10 MED ORDER — ONDANSETRON 8 MG PO TBDP
8.0000 mg | ORAL_TABLET | Freq: Once | ORAL | Status: AC
  Administered 2011-06-10: 8 mg via ORAL
  Filled 2011-06-10: qty 1

## 2011-06-10 NOTE — ED Notes (Signed)
Patient has been having right flank pain and right lower abd pain. States he thinks he is passing a kidney stone

## 2011-06-10 NOTE — ED Notes (Signed)
Prior to second dose of dilaudid, patient was informed that he would need to find a ride home. Patient stated that his wife was coming to get him. After medication was given patient stated that he had tried to call six times and that she was not answering the phone. Patient was given the choice to get a taxi to take him home or wait  A few hours for the medication to wear off. At this time patient states that he will wait.

## 2011-06-10 NOTE — ED Notes (Addendum)
Pt called out from room and I answered the call, he stated that he has tried 6 times to reach a ride and what could we do. I suggested a cab and informed the RN Nettie Elm.

## 2011-06-10 NOTE — ED Provider Notes (Signed)
History     CSN: [id number] Arrival date & time: 06/10/2011  2:38 AM   First MD Initiated Contact with Patient 06/10/11 0229      Chief Complaint  Patient presents with  . Flank Pain    (Consider location/radiation/quality/duration/timing/severity/associated sxs/prior treatment) Patient is a 40 y.o. male presenting with flank pain. The history is provided by the patient.  Flank Pain This is a new problem. The current episode started 1 to 2 hours ago. The problem occurs constantly. The problem has not changed since onset.Associated symptoms include abdominal pain. Pertinent negatives include no chest pain, no headaches and no shortness of breath. The symptoms are aggravated by nothing. The symptoms are relieved by nothing. He has tried nothing for the symptoms.   at home tonight when developed right flank pain radiating to right lower abdomen, sharp in quality, severe and similar to previous kidney stones. No aggravating or alleviating factors. Constant since onset and unchanged.  Past Medical History  Diagnosis Date  . Spontaneous pneumothorax   . Kidney calculi   . COPD (chronic obstructive pulmonary disease)     History reviewed. No pertinent past surgical history.  No family history on file.  History  Substance Use Topics  . Smoking status: Current Everyday Smoker  . Smokeless tobacco: Not on file  . Alcohol Use: No      Review of Systems  Constitutional: Negative for fever and chills.  HENT: Negative for neck pain and neck stiffness.   Eyes: Negative for pain.  Respiratory: Negative for shortness of breath.   Cardiovascular: Negative for chest pain.  Gastrointestinal: Positive for abdominal pain.  Genitourinary: Positive for flank pain. Negative for dysuria.  Musculoskeletal: Negative for back pain.  Skin: Negative for rash.  Neurological: Negative for headaches.  All other systems reviewed and are negative.    Allergies  Review of patient's allergies  indicates no known allergies.  Home Medications  No current outpatient prescriptions on file.  BP 181/109  Pulse 118  Resp 20  SpO2 98%  Physical Exam  Constitutional: He is oriented to person, place, and time. He appears well-developed and well-nourished.  HENT:  Head: Normocephalic and atraumatic.  Eyes: Conjunctivae and EOM are normal. Pupils are equal, round, and reactive to light.  Neck: Trachea normal. Neck supple. No thyromegaly present.  Cardiovascular: Normal rate, regular rhythm, S1 normal, S2 normal and normal pulses.     No systolic murmur is present   No diastolic murmur is present  Pulses:      Radial pulses are 2+ on the right side, and 2+ on the left side.  Pulmonary/Chest: Effort normal and breath sounds normal. He has no wheezes. He has no rhonchi. He has no rales. He exhibits no tenderness.  Abdominal: Soft. Normal appearance and bowel sounds are normal. There is no tenderness. There is no CVA tenderness and negative Murphy's sign.       Localizes discomfort to right flank and right lower cautery region without any reproducible tenderness. No discoloration, rash or lesion  Musculoskeletal:       BLE:s Calves nontender, no cords or erythema, negative Homans sign  Neurological: He is alert and oriented to person, place, and time. He has normal strength. No cranial nerve deficit or sensory deficit. GCS eye subscore is 4. GCS verbal subscore is 5. GCS motor subscore is 6.  Skin: Skin is warm and dry. No rash noted. He is not diaphoretic.  Psychiatric: His speech is normal.  Cooperative and appropriate    ED Course  Procedures (including critical care time)  Results for orders placed during the hospital encounter of 06/10/11  URINALYSIS, ROUTINE W REFLEX MICROSCOPIC      Component Value Range   Color, Urine YELLOW  YELLOW    Appearance CLEAR  CLEAR    Specific Gravity, Urine 1.006  1.005 - 1.030    pH 6.5  5.0 - 8.0    Glucose, UA NEGATIVE  NEGATIVE  (mg/dL)   Hgb urine dipstick SMALL (*) NEGATIVE    Bilirubin Urine NEGATIVE  NEGATIVE    Ketones, ur NEGATIVE  NEGATIVE (mg/dL)   Protein, ur NEGATIVE  NEGATIVE (mg/dL)   Urobilinogen, UA 0.2  0.0 - 1.0 (mg/dL)   Nitrite NEGATIVE  NEGATIVE    Leukocytes, UA NEGATIVE  NEGATIVE   URINE MICROSCOPIC-ADD ON      Component Value Range   Squamous Epithelial / LPF RARE  RARE    WBC, UA 0-2  <3 (WBC/hpf)   RBC / HPF 0-2  <3 (RBC/hpf)   Bacteria, UA RARE  RARE    Ct Abdomen Pelvis Wo Contrast  06/10/2011  *RADIOLOGY REPORT*  Clinical Data: Right flank pain and right lower abdominal pain.  CT ABDOMEN AND PELVIS WITHOUT CONTRAST  Technique:  Multidetector CT imaging of the abdomen and pelvis was performed following the standard protocol without intravenous contrast.  Comparison: CT of the abdomen and pelvis performed 05/04/2009  Findings: The visualized lung bases are clear.  The liver and spleen are unremarkable in appearance.  The gallbladder is within normal limits.  The pancreas and adrenal glands are unremarkable.  There is no evidence of hydronephrosis.  The right ureter is not particularly prominent.  However, there is an apparent 3 x 3 mm stone noted at the right vesicoureteral junction, possibly reflecting an incompletely obstructing right ureteral stone. Numerous nonobstructing stones are noted within both kidneys, measuring up to 5 mm in size.  There is no evidence of perinephric stranding.  No free fluid is identified.  The small bowel is unremarkable in appearance.  The stomach is filled with solid material and is within normal limits.  No acute vascular abnormalities are seen. Mild scattered calcification is noted along the distal abdominal aorta  The appendix is normal in caliber and contains air, without evidence for appendicitis.  It is noted directly adjacent to the inferior tip of the liver.  The colon is largely filled with stool and is unremarkable in appearance.  The bladder is  decompressed and not well assessed.  The prostate remains normal in size, with minimal calcification.  No inguinal lymphadenopathy is seen.  No acute osseous abnormalities are identified.  IMPRESSION:  1.  Apparent 3 x 3 mm stone noted at the right vesicoureteral junction, possibly reflecting an incompletely obstructing distal right ureteral stone.  The right ureter is not particularly prominent, and there is no evidence of hydronephrosis. 2.  Numerous nonobstructing stones within both kidneys, measuring up to 5 mm in size.  Original Report Authenticated By: Tonia Ghent, M.D.    Intramuscular Dilaudid with improvement of pain, CT scan and UA reviewed as above   MDM   Right flank pain with kidney stone on CT scan. 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 No UTI. Plan discharge home with close outpatient followup. Kidney stone precautions provided. Nursing notes reviewed. Vital signs improved. Stable for discharge home        Sunnie Nielsen, MD 06/10/11 [phone]

## 2011-09-19 ENCOUNTER — Other Ambulatory Visit: Payer: Self-pay

## 2011-09-19 ENCOUNTER — Emergency Department (INDEPENDENT_AMBULATORY_CARE_PROVIDER_SITE_OTHER): Payer: Self-pay

## 2011-09-19 ENCOUNTER — Encounter (HOSPITAL_BASED_OUTPATIENT_CLINIC_OR_DEPARTMENT_OTHER): Payer: Self-pay | Admitting: *Deleted

## 2011-09-19 ENCOUNTER — Emergency Department (HOSPITAL_BASED_OUTPATIENT_CLINIC_OR_DEPARTMENT_OTHER)
Admission: EM | Admit: 2011-09-19 | Discharge: 2011-09-19 | Disposition: A | Discharge status: Home / Self Care | Payer: Self-pay | Attending: Emergency Medicine | Admitting: Emergency Medicine

## 2011-09-19 DIAGNOSIS — J984 Other disorders of lung: Secondary | ICD-10-CM

## 2011-09-19 DIAGNOSIS — R0989 Other specified symptoms and signs involving the circulatory and respiratory systems: Secondary | ICD-10-CM | POA: Insufficient documentation

## 2011-09-19 DIAGNOSIS — B029 Zoster without complications: Secondary | ICD-10-CM | POA: Insufficient documentation

## 2011-09-19 DIAGNOSIS — M546 Pain in thoracic spine: Secondary | ICD-10-CM | POA: Insufficient documentation

## 2011-09-19 DIAGNOSIS — R079 Chest pain, unspecified: Secondary | ICD-10-CM

## 2011-09-19 DIAGNOSIS — J4489 Other specified chronic obstructive pulmonary disease: Secondary | ICD-10-CM | POA: Insufficient documentation

## 2011-09-19 DIAGNOSIS — J449 Chronic obstructive pulmonary disease, unspecified: Secondary | ICD-10-CM | POA: Insufficient documentation

## 2011-09-19 DIAGNOSIS — R0609 Other forms of dyspnea: Secondary | ICD-10-CM | POA: Insufficient documentation

## 2011-09-19 DIAGNOSIS — M549 Dorsalgia, unspecified: Secondary | ICD-10-CM

## 2011-09-19 DIAGNOSIS — R0602 Shortness of breath: Secondary | ICD-10-CM | POA: Insufficient documentation

## 2011-09-19 DIAGNOSIS — Z8701 Personal history of pneumonia (recurrent): Secondary | ICD-10-CM

## 2011-09-19 MED ORDER — ACYCLOVIR 800 MG PO TABS: 800.0000 mg | ORAL_TABLET | Freq: Four times a day (QID) | ORAL | Status: AC

## 2011-09-19 MED ORDER — KETOROLAC TROMETHAMINE 60 MG/2ML IM SOLN
60.0000 mg | Freq: Once | INTRAMUSCULAR | Status: AC
  Administered 2011-09-19: 60 mg via INTRAMUSCULAR
  Filled 2011-09-19: qty 2

## 2011-09-19 NOTE — Discharge Instructions (Signed)

## 2011-09-19 NOTE — ED Provider Notes (Signed)
History     CSN: [id number]  Arrival date & time 09/19/11  1220   First MD Initiated Contact with Patient 09/19/11 1246      Chief Complaint  Patient presents with  . Pleurisy  . Shortness of Breath    (Consider location/radiation/quality/duration/timing/severity/associated sxs/prior treatment) HPI  The patient pain with left mid back for 2 days. This did not start acutely. He states that he does have pain which has gotten worse in the left side and anterior chest over the skin. He describes it as burning and is very tender even with only light touch such as fissure. He did have a spontaneous pneumothorax and required surgery in the past. He he has mild dyspnea but states that this is about baseline with his smoking. He denies any fever or cough beyond baseline.  Past Medical History  Diagnosis Date  . Spontaneous pneumothorax   . Kidney calculi   . COPD (chronic obstructive pulmonary disease)     History reviewed. No pertinent past surgical history.  History reviewed. No pertinent family history.  History  Substance Use Topics  . Smoking status: Current Everyday Smoker  . Smokeless tobacco: Not on file  . Alcohol Use: No      Review of Systems  All other systems reviewed and are negative.    Allergies  Review of patient's allergies indicates no known allergies.  Home Medications  No current outpatient prescriptions on file.  BP 187/99  Pulse 99  Temp(Src) 97.3 F (36.3 C) (Oral)  Ht 5\' 7"  (1.702 m)  Wt 127 lb (57.607 kg)  BMI 19.89 kg/m2  SpO2 99%  Physical Exam  Nursing note and vitals reviewed. Constitutional: He is oriented to person, place, and time. He appears well-developed and well-nourished.  HENT:  Head: Normocephalic and atraumatic.  Eyes: Conjunctivae and EOM are normal. Pupils are equal, round, and reactive to light.  Neck: Normal range of motion. Neck supple.  Cardiovascular: Normal rate and regular rhythm.   Pulmonary/Chest: Effort  normal and breath sounds normal.       Mild erythematous area left anterior axillary line 2 x 1 cm. No other lesions noted.  Abdominal: Soft. Bowel sounds are normal.  Musculoskeletal: Normal range of motion.  Neurological: He is alert and oriented to person, place, and time.  Skin: Skin is warm and dry.  Psychiatric: He has a normal mood and affect.    ED Course  Procedures (including critical care time)  Labs Reviewed - No data to display Dg Chest 2 View  09/19/2011  *RADIOLOGY REPORT*  Clinical Data: 41 year old male with left chest pain and back pain.  CHEST - 2 VIEW  Comparison: 03/11/2011 and earlier.  Findings: Chronic lung disease with biapical architectural distortion.  Stable postoperative changes to the left apex.  Stable peripheral/pleural density on the right.  Hilar retraction.  Stable cardiac size and mediastinal contours.  No pneumothorax, pulmonary edema, pleural effusion or acute pulmonary opacity. No acute osseous abnormality identified.  IMPRESSION: Stable chronic lung disease.  No superimposed acute findings are identified.  Original Report Authenticated By: Harley Hallmark, M.D.     No diagnosis found.   Date: 09/19/2011  Rate: 84  Rhythm: normal sinus rhythm  QRS Axis: normal  Intervals: normal  ST/T Wave abnormalities: early repolarization  Conduction Disutrbances:none  Narrative Interpretation:   Old EKG Reviewed: unchanged    MDM  Patient has no evidence of pneumothorax on plain x-rays. The symptomatology in the left lateral chest and anterior  chest are most consistent with shingles. This began today and he has one red lesion. He'll be started on antivirals.       Hilario Quarry, MD 09/19/11 [phone]

## 2011-09-19 NOTE — ED Notes (Signed)
RT assessed patient upon arrival. He stated he has a hx of left lung "collapse." Very sore to touch. SAT 100% on RA, no distress noted, just pain. EKG done. RT will continue to monitor.

## 2011-09-19 NOTE — ED Notes (Signed)
Pt amb to room 4 with quick steady gait in nad. Pt reports sob and chest tenderness x Tuesday. Pt states he has hx of ptx so wants to get checked out. Lungs are decreased to left side, but present. Right lungs are clear. Pt states he also has back pain, and that his chest is tender "even if my shirt touches it, it hurts really back". Pt also reports pain with certain movements and positions. Rt at bedside, sats on room air are 99%, resp easy and reg, monitor shows sinus at rate of 93.

## 2011-10-16 ENCOUNTER — Emergency Department (INDEPENDENT_AMBULATORY_CARE_PROVIDER_SITE_OTHER): Payer: Self-pay

## 2011-10-16 ENCOUNTER — Emergency Department (HOSPITAL_BASED_OUTPATIENT_CLINIC_OR_DEPARTMENT_OTHER)
Admission: EM | Admit: 2011-10-16 | Discharge: 2011-10-16 | Disposition: A | Discharge status: Home / Self Care | Payer: Self-pay | Attending: Emergency Medicine | Admitting: Emergency Medicine

## 2011-10-16 ENCOUNTER — Encounter (HOSPITAL_BASED_OUTPATIENT_CLINIC_OR_DEPARTMENT_OTHER): Payer: Self-pay | Admitting: *Deleted

## 2011-10-16 ENCOUNTER — Other Ambulatory Visit: Payer: Self-pay

## 2011-10-16 DIAGNOSIS — R5383 Other fatigue: Secondary | ICD-10-CM | POA: Insufficient documentation

## 2011-10-16 DIAGNOSIS — J8410 Pulmonary fibrosis, unspecified: Secondary | ICD-10-CM | POA: Insufficient documentation

## 2011-10-16 DIAGNOSIS — J4489 Other specified chronic obstructive pulmonary disease: Secondary | ICD-10-CM | POA: Insufficient documentation

## 2011-10-16 DIAGNOSIS — R5381 Other malaise: Secondary | ICD-10-CM | POA: Insufficient documentation

## 2011-10-16 DIAGNOSIS — R197 Diarrhea, unspecified: Secondary | ICD-10-CM | POA: Insufficient documentation

## 2011-10-16 DIAGNOSIS — R1031 Right lower quadrant pain: Secondary | ICD-10-CM | POA: Insufficient documentation

## 2011-10-16 DIAGNOSIS — R6883 Chills (without fever): Secondary | ICD-10-CM | POA: Insufficient documentation

## 2011-10-16 DIAGNOSIS — J449 Chronic obstructive pulmonary disease, unspecified: Secondary | ICD-10-CM | POA: Insufficient documentation

## 2011-10-16 DIAGNOSIS — R110 Nausea: Secondary | ICD-10-CM | POA: Insufficient documentation

## 2011-10-16 DIAGNOSIS — N200 Calculus of kidney: Secondary | ICD-10-CM

## 2011-10-16 DIAGNOSIS — R071 Chest pain on breathing: Secondary | ICD-10-CM | POA: Insufficient documentation

## 2011-10-16 DIAGNOSIS — R109 Unspecified abdominal pain: Secondary | ICD-10-CM

## 2011-10-16 LAB — CBC
Hemoglobin: 15.4 g/dL (ref 13.0–17.0)
MCH: 30 pg (ref 26.0–34.0)
MCHC: 33.6 g/dL (ref 30.0–36.0)
Platelets: 256 10*3/uL (ref 150–400)
RDW: 13.5 % (ref 11.5–15.5)

## 2011-10-16 LAB — COMPREHENSIVE METABOLIC PANEL
AST: 14 U/L (ref 0–37)
Albumin: 4.1 g/dL (ref 3.5–5.2)
Alkaline Phosphatase: 64 U/L (ref 39–117)
Chloride: 99 mEq/L (ref 96–112)
Potassium: 4 mEq/L (ref 3.5–5.1)
Sodium: 137 mEq/L (ref 135–145)
Total Bilirubin: 0.4 mg/dL (ref 0.3–1.2)
Total Protein: 8 g/dL (ref 6.0–8.3)

## 2011-10-16 LAB — LACTIC ACID, PLASMA: Lactic Acid, Venous: 1 mmol/L (ref 0.5–2.2)

## 2011-10-16 LAB — DIFFERENTIAL
Basophils Absolute: 0 10*3/uL (ref 0.0–0.1)
Basophils Relative: 0 % (ref 0–1)
Eosinophils Absolute: 0.3 10*3/uL (ref 0.0–0.7)
Neutro Abs: 6.8 10*3/uL (ref 1.7–7.7)
Neutrophils Relative %: 67 % (ref 43–77)

## 2011-10-16 LAB — POCT I-STAT 3, ART BLOOD GAS (G3+)
O2 Saturation: 96 %
pCO2 arterial: 45.3 mmHg — ABNORMAL HIGH (ref 35.0–45.0)
pH, Arterial: 7.373 (ref 7.350–7.450)
pO2, Arterial: 82 mmHg (ref 80.0–100.0)

## 2011-10-16 LAB — RAPID URINE DRUG SCREEN, HOSP PERFORMED
Barbiturates: NOT DETECTED
Benzodiazepines: NOT DETECTED
Cocaine: NOT DETECTED
Opiates: POSITIVE — AB
Tetrahydrocannabinol: NOT DETECTED

## 2011-10-16 LAB — URINALYSIS, ROUTINE W REFLEX MICROSCOPIC
Bilirubin Urine: NEGATIVE
Glucose, UA: NEGATIVE mg/dL
Ketones, ur: NEGATIVE mg/dL
Leukocytes, UA: NEGATIVE
Nitrite: NEGATIVE
Specific Gravity, Urine: 1.016 (ref 1.005–1.030)
pH: 6.5 (ref 5.0–8.0)

## 2011-10-16 LAB — LIPASE, BLOOD: Lipase: 14 U/L (ref 11–59)

## 2011-10-16 LAB — GLUCOSE, CAPILLARY: Glucose-Capillary: 98 mg/dL (ref 70–99)

## 2011-10-16 MED ORDER — SODIUM CHLORIDE 0.9 % IV BOLUS (SEPSIS)
1000.0000 mL | Freq: Once | INTRAVENOUS | Status: AC
  Administered 2011-10-16: 1000 mL via INTRAVENOUS

## 2011-10-16 MED ORDER — IOHEXOL 300 MG/ML  SOLN
100.0000 mL | Freq: Once | INTRAMUSCULAR | Status: AC | PRN
  Administered 2011-10-16: 100 mL via INTRAVENOUS

## 2011-10-16 MED ORDER — IOHEXOL 300 MG/ML  SOLN: 20.0000 mL | INTRAMUSCULAR | Status: AC

## 2011-10-16 NOTE — ED Notes (Signed)
MD at bedside. 

## 2011-10-16 NOTE — Discharge Instructions (Signed)

## 2011-10-16 NOTE — ED Provider Notes (Signed)
History     CSN: [id number]  Arrival date & time 10/16/11  1728   First MD Initiated Contact with Patient 10/16/11 1746      Chief Complaint  Patient presents with  . Abdominal Pain     Patient is a 41 y.o. male presenting with abdominal pain. The history is provided by the patient. The history is limited by the condition of the patient.  Abdominal Pain The primary symptoms of the illness include abdominal pain, fatigue, nausea and diarrhea. The primary symptoms of the illness do not include vomiting or hematochezia. The current episode started more than 2 days ago. The onset of the illness was gradual. The problem has been gradually worsening.  Additional symptoms associated with the illness include chills.  nothing improves his symptoms - he has tried ASA for his symptoms without relief Nothing worsens his symptoms  Pt reports RLQ abd pain for several days Also reports "weakness" and fatigue Also reports left sided chest wall pain which he has had previously No fever recorded  Past Medical History  Diagnosis Date  . Spontaneous pneumothorax   . Kidney calculi   . COPD (chronic obstructive pulmonary disease)     History reviewed. No pertinent past surgical history.  History reviewed. No pertinent family history.  History  Substance Use Topics  . Smoking status: Current Everyday Smoker -- 1.0 packs/day  . Smokeless tobacco: Not on file  . Alcohol Use: No      Review of Systems  Unable to perform ROS: Mental status change  Constitutional: Positive for chills and fatigue.  Gastrointestinal: Positive for nausea, abdominal pain and diarrhea. Negative for vomiting and hematochezia.    Allergies  Review of patient's allergies indicates no known allergies.  Home Medications  No current outpatient prescriptions on file.  BP 119/85  Pulse 89  Temp(Src) 98.5 F (36.9 C) (Oral)  Resp 16  Ht 5\' 7"  (1.702 m)  Wt 128 lb (58.06 kg)  BMI 20.05 kg/m2  SpO2  98%  Physical Exam CONSTITUTIONAL: somnolent but easily arousable HEAD AND FACE: Normocephalic/atraumatic EYES: EOMI/PERRL, no scleral icterus ENMT: Mucous membranes dry NECK: supple no meningeal signs SPINE:entire spine nontender CV: S1/S2 noted, no murmurs/rubs/gallops noted LUNGS: Lungs are clear to auscultation bilaterally, no apparent distress Chest - tender to palpation no crepitance/bruising ABDOMEN: soft, +RLQ tenderness, no rebound or guarding, tenderness moderate GU:no cva tenderness, no hernia/testicular tenderness noted.  Chaperone present Rectal - prostate enlarged but nontender.  Stool color brown.  No melena/blood noted.  Chaperone present NEURO: Pt is somnolent but arousable, maex4 EXTREMITIES: pulses normal, full ROM SKIN: warm, color normal PSYCH: no abnormalities of mood noted  ED Course  Procedures    Labs Reviewed  CBC  DIFFERENTIAL  GLUCOSE, CAPILLARY  COMPREHENSIVE METABOLIC PANEL  LIPASE, BLOOD  URINALYSIS, ROUTINE W REFLEX MICROSCOPIC  ETHANOL  URINE RAPID DRUG SCREEN (HOSP PERFORMED)  ACETAMINOPHEN LEVEL  SALICYLATE LEVEL  LACTIC ACID, PLASMA  TROPONIN I   Dg Chest Port 1 View  10/16/2011  *RADIOLOGY REPORT*  Clinical Data: Weakness.  Lethargy.  PORTABLE CHEST - 1 VIEW  Comparison: 09/19/2011  Findings: The cardiac silhouette, mediastinal and hilar contours are within normal limits and stable.  Elevation of the pulmonary hila due to severe upper lobe scarring changes. Stable left apical surgical changes.  Stable emphysematous changes.  No acute pulmonary findings.  No pleural effusion.  The bony thorax is intact.  IMPRESSION: Chronic emphysematous changes and pulmonary scarring.  No definite acute overlying pulmonary  process.  Original Report Authenticated By: P. Loralie Champagne, M.D.   6:21 PM Pt somnolent but easily arousable. He is a poor historian   Localizes pain to RLQ which he reports is not like previous abd pain.   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       MDM  Nursing notes reviewed and considered in documentation Previous records reviewed and considered xrays reviewed and considered All labs/vitals reviewed and considered    Date: 10/16/2011  Rate: 90  Rhythm: normal sinus rhythm  QRS Axis: right  Intervals: normal  ST/T Wave abnormalities: nonspecific ST changes  Conduction Disutrbances:none  Narrative Interpretation:   Old EKG Reviewed: unchanged        Joya Gaskins, MD 10/16/11 2107

## 2011-10-16 NOTE — ED Notes (Signed)
Pt c/o weakness and right abd pain x 2 days

## 2011-10-16 NOTE — ED Notes (Signed)
Pt spoke with wife via phone-states she is en route for transport home

## 2012-01-15 ENCOUNTER — Emergency Department (HOSPITAL_BASED_OUTPATIENT_CLINIC_OR_DEPARTMENT_OTHER): Payer: Self-pay

## 2012-01-15 ENCOUNTER — Emergency Department (HOSPITAL_BASED_OUTPATIENT_CLINIC_OR_DEPARTMENT_OTHER)
Admission: EM | Admit: 2012-01-15 | Discharge: 2012-01-15 | Disposition: A | Discharge status: Home / Self Care | Payer: Self-pay | Attending: Emergency Medicine | Admitting: Emergency Medicine

## 2012-01-15 DIAGNOSIS — F17200 Nicotine dependence, unspecified, uncomplicated: Secondary | ICD-10-CM | POA: Insufficient documentation

## 2012-01-15 DIAGNOSIS — J4489 Other specified chronic obstructive pulmonary disease: Secondary | ICD-10-CM | POA: Insufficient documentation

## 2012-01-15 DIAGNOSIS — N451 Epididymitis: Secondary | ICD-10-CM

## 2012-01-15 DIAGNOSIS — N453 Epididymo-orchitis: Secondary | ICD-10-CM | POA: Insufficient documentation

## 2012-01-15 DIAGNOSIS — J449 Chronic obstructive pulmonary disease, unspecified: Secondary | ICD-10-CM | POA: Insufficient documentation

## 2012-01-15 LAB — URINALYSIS, ROUTINE W REFLEX MICROSCOPIC
Bilirubin Urine: NEGATIVE
Specific Gravity, Urine: 1.017 (ref 1.005–1.030)
Urobilinogen, UA: 0.2 mg/dL (ref 0.0–1.0)
pH: 6 (ref 5.0–8.0)

## 2012-01-15 LAB — URINE MICROSCOPIC-ADD ON

## 2012-01-15 LAB — BASIC METABOLIC PANEL
BUN: 11 mg/dL (ref 6–23)
CO2: 27 mEq/L (ref 19–32)
Chloride: 103 mEq/L (ref 96–112)
Glucose, Bld: 91 mg/dL (ref 70–99)
Potassium: 3.7 mEq/L (ref 3.5–5.1)
Sodium: 140 mEq/L (ref 135–145)

## 2012-01-15 LAB — CBC WITH DIFFERENTIAL/PLATELET
Hemoglobin: 13.8 g/dL (ref 13.0–17.0)
Lymphocytes Relative: 22 % (ref 12–46)
Lymphs Abs: 2.6 10*3/uL (ref 0.7–4.0)
MCH: 30.1 pg (ref 26.0–34.0)
Monocytes Relative: 7 % (ref 3–12)
Neutro Abs: 8 10*3/uL — ABNORMAL HIGH (ref 1.7–7.7)
Neutrophils Relative %: 69 % (ref 43–77)
Platelets: 277 10*3/uL (ref 150–400)
RBC: 4.59 MIL/uL (ref 4.22–5.81)
WBC: 11.7 10*3/uL — ABNORMAL HIGH (ref 4.0–10.5)

## 2012-01-15 MED ORDER — OXYCODONE-ACETAMINOPHEN 5-325 MG PO TABS: 2.0000 | ORAL_TABLET | Freq: Once | ORAL | Status: AC

## 2012-01-15 MED ORDER — PHENAZOPYRIDINE HCL 200 MG PO TABS: 200.0000 mg | ORAL_TABLET | Freq: Three times a day (TID) | ORAL | Status: AC

## 2012-01-15 MED ORDER — CIPROFLOXACIN 500 MG/5ML (10%) PO SUSR
500.0000 mg | Freq: Once | ORAL | Status: DC
  Filled 2012-01-15: qty 5

## 2012-01-15 MED ORDER — OXYCODONE-ACETAMINOPHEN 5-325 MG PO TABS
2.0000 | ORAL_TABLET | Freq: Once | ORAL | Status: AC
  Administered 2012-01-15: 2 via ORAL
  Filled 2012-01-15 (×2): qty 2

## 2012-01-15 MED ORDER — CIPROFLOXACIN HCL 500 MG PO TABS: 500.0000 mg | ORAL_TABLET | Freq: Two times a day (BID) | ORAL | Status: AC

## 2012-01-15 MED ORDER — CIPROFLOXACIN HCL 500 MG PO TABS
500.0000 mg | ORAL_TABLET | Freq: Once | ORAL | Status: AC
  Administered 2012-01-15: 500 mg via ORAL
  Filled 2012-01-15 (×2): qty 1

## 2012-01-15 MED ORDER — ONDANSETRON HCL 4 MG/2ML IJ SOLN
4.0000 mg | Freq: Once | INTRAMUSCULAR | Status: AC
  Administered 2012-01-15: 4 mg via INTRAVENOUS
  Filled 2012-01-15: qty 2

## 2012-01-15 MED ORDER — HYDROMORPHONE HCL PF 1 MG/ML IJ SOLN
1.0000 mg | Freq: Once | INTRAMUSCULAR | Status: AC
  Administered 2012-01-15: 1 mg via INTRAVENOUS
  Filled 2012-01-15: qty 1

## 2012-01-15 NOTE — ED Provider Notes (Signed)
History     CSN: [id number]  Arrival date & time 01/15/12  1243   First MD Initiated Contact with Patient 01/15/12 1253      Chief Complaint  Patient presents with  . Nephrolithiasis    (Consider location/radiation/quality/duration/timing/severity/associated sxs/prior treatment) HPI Pt with history of renal stones reports he had onset of severe L testicle and penile pain last night, associated with itching, urinary frequency, urgency and hesitancy. He denies any back pain to me, but apparently reported back pain to the nurse. He denies any blood in urine. He has had nausea, but no vomiting. No fever.  Past Medical History  Diagnosis Date  . Spontaneous pneumothorax   . Kidney calculi   . COPD (chronic obstructive pulmonary disease)     No past surgical history on file.  No family history on file.  History  Substance Use Topics  . Smoking status: Current Everyday Smoker -- 1.0 packs/day  . Smokeless tobacco: Not on file  . Alcohol Use: No      Review of Systems All other systems reviewed and are negative except as noted in HPI.   Allergies  Review of patient's allergies indicates no known allergies.  Home Medications  No current outpatient prescriptions on file.  BP 163/96  Pulse 92  Temp 97.9 F (36.6 C) (Oral)  Resp 18  Ht 5\' 7"  (1.702 m)  Wt 132 lb (59.875 kg)  BMI 20.67 kg/m2  SpO2 98%  Physical Exam  Nursing note and vitals reviewed. Constitutional: He is oriented to person, place, and time. He appears well-developed and well-nourished.  HENT:  Head: Normocephalic and atraumatic.  Eyes: EOM are normal. Pupils are equal, round, and reactive to light.  Neck: Normal range of motion. Neck supple.  Cardiovascular: Normal rate, normal heart sounds and intact distal pulses.   Pulmonary/Chest: Effort normal and breath sounds normal.  Abdominal: Bowel sounds are normal. He exhibits no distension. There is no tenderness. Hernia confirmed negative in the right  inguinal area and confirmed negative in the left inguinal area.  Genitourinary: Penis normal. Right testis shows no mass and no tenderness. Left testis shows tenderness. Left testis shows no mass. Cremasteric reflex is absent on the left side. Circumcised. No penile tenderness. No discharge found.  Musculoskeletal: Normal range of motion. He exhibits no edema and no tenderness.  Neurological: He is alert and oriented to person, place, and time. He has normal strength. No cranial nerve deficit or sensory deficit.  Skin: Skin is warm and dry. No rash noted.  Psychiatric: He has a normal mood and affect.    ED Course  Procedures (including critical care time)  Labs Reviewed  CBC WITH DIFFERENTIAL - Abnormal; Notable for the following:    WBC 11.7 (*)     Neutro Abs 8.0 (*)     All other components within normal limits  URINALYSIS, ROUTINE W REFLEX MICROSCOPIC - Abnormal; Notable for the following:    Hgb urine dipstick TRACE (*)     All other components within normal limits  URINE MICROSCOPIC-ADD ON - Abnormal; Notable for the following:    Bacteria, UA FEW (*)     All other components within normal limits  BASIC METABOLIC PANEL  URINE CULTURE  GC/CHLAMYDIA PROBE AMP, URINE   US Scrotum  01/15/2012  *RADIOLOGY REPORT*  Clinical Data:  Severe left testicular pain, evaluate for torsion  SCROTAL ULTRASOUND DOPPLER ULTRASOUND OF THE TESTICLES  Technique: Complete ultrasound examination of the testicles, epididymis, and other scrotal structures  was performed.  Color and spectral Doppler ultrasound were also utilized to evaluate blood flow to the testicles.  Comparison:  None.  Findings:  Right testis:  Normal in size and appearance, measuring 4.7 x 2.8 x 2.9 cm.  Left testis:  Normal in size and appearance, measuring 4.5 x 2.2 x 2.9 cm.  Right epididymis:  Normal in size and appearance.  Left epididymis:  Normal in size and appearance.  Hydocele:  Absent.  Varicocele:  Absent.  Pulsed Doppler  interrogation of both testes demonstrates low resistance flow bilaterally.  IMPRESSION: Negative scrotal ultrasound.  No evidence of testicular torsion.  Original Report Authenticated By: Charline Bills, M.D.   US Renal  01/15/2012  *RADIOLOGY REPORT*  Clinical Data: Left renal stent, history of renal stone  RENAL/URINARY TRACT ULTRASOUND COMPLETE  Comparison:  CT abdomen pelvis dated 10/16/2011  Findings:  Right Kidney:  Measures 11.1 cm.  Multiple nonobstructing calculi measuring up to 6 mm.  No hydronephrosis.  Left Kidney:  Measures 10.3 cm.  Multiple nonobstructing calculi measuring up to 7 mm.  No hydronephrosis.  Bladder:  Underdistended.  IMPRESSION: No hydronephrosis.  Multiple nonobstructing renal calculi measuring up to 7 mm, as described above.  Original Report Authenticated By: Charline Bills, M.D.   [place] Art/ven Flow Abd Pelv Doppler  01/15/2012  *RADIOLOGY REPORT*  Clinical Data:  Severe left testicular pain, evaluate for torsion  SCROTAL ULTRASOUND DOPPLER ULTRASOUND OF THE TESTICLES  Technique: Complete ultrasound examination of the testicles, epididymis, and other scrotal structures was performed.  Color and spectral Doppler ultrasound were also utilized to evaluate blood flow to the testicles.  Comparison:  None.  Findings:  Right testis:  Normal in size and appearance, measuring 4.7 x 2.8 x 2.9 cm.  Left testis:  Normal in size and appearance, measuring 4.5 x 2.2 x 2.9 cm.  Right epididymis:  Normal in size and appearance.  Left epididymis:  Normal in size and appearance.  Hydocele:  Absent.  Varicocele:  Absent.  Pulsed Doppler interrogation of both testes demonstrates low resistance flow bilaterally.  IMPRESSION: Negative scrotal ultrasound.  No evidence of testicular torsion.  Original Report Authenticated By: Charline Bills, M.D.     No diagnosis found.    MDM  Pt reports some improvement but pain returned when he urinated. Labs and imaging as above. Doubt this is ureteral  colic. Suspect epididymitis/orchitis. Will start Abx, pain meds, pyridium and PCP followup.         Charles B. Bernette Mayers, MD 01/15/12 1446

## 2012-01-15 NOTE — ED Notes (Signed)
C/o pain in left back for 4-5 days.  Last night started having dysuria, frequency and itching from penis.

## 2012-01-17 LAB — URINE CULTURE: Culture: NO GROWTH

## 2012-02-05 IMAGING — CT CT ANGIO CHEST
2 of 6 series · 19 of 36 positions shown · IV contrast (APPLIED)
Comparison: Plain film chest earlier this same day and CT chest
08/11/2010.

CLINICAL DATA: Chest pain and cough.

CT ANGIOGRAPHY CHEST WITH CONTRAST
TECHNIQUE: Multidetector CT imaging of the chest was performed
using the standard protocol during bolus administration of
intravenous contrast.  Multiplanar CT image reconstructions
including MIPs were obtained to evaluate the vascular anatomy.
Contrast:  100 ml Omnipaque-H55 D.

[Series 5: pe 1.0 b25f · axial · 0.69mm/px · z∈[-333,-63]mm · 18 of 302 slices shown]
[im 16/302  lung]
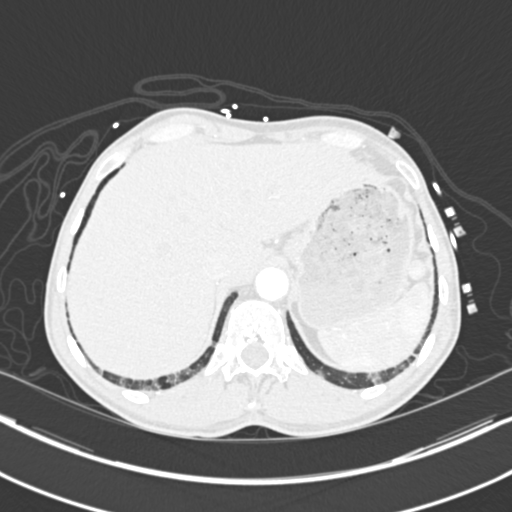
[im 31/302  mediastinal]
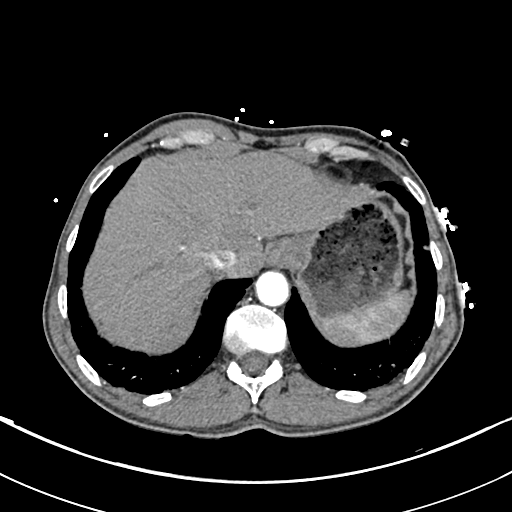
[im 46/302  lung]
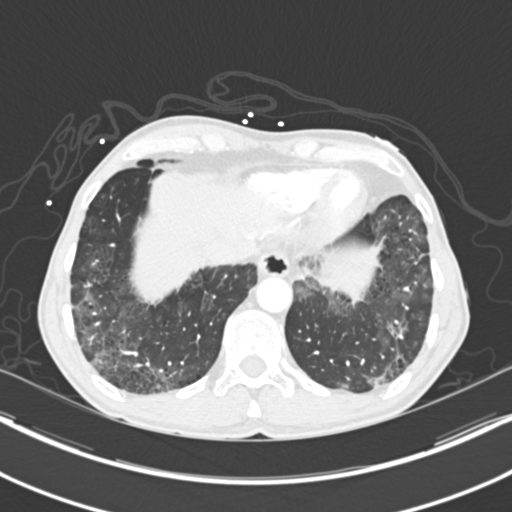
[im 61/302  mediastinal]
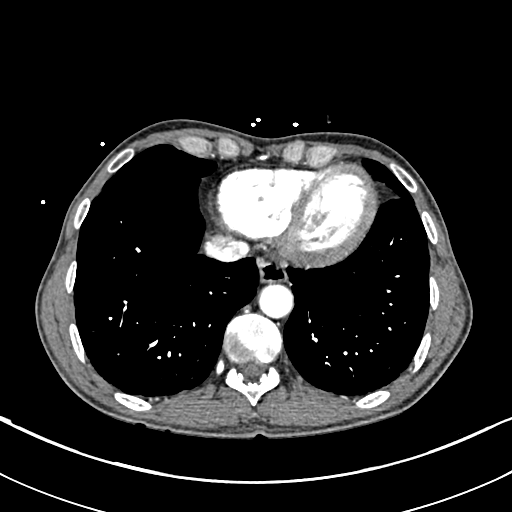
[im 76/302  lung]
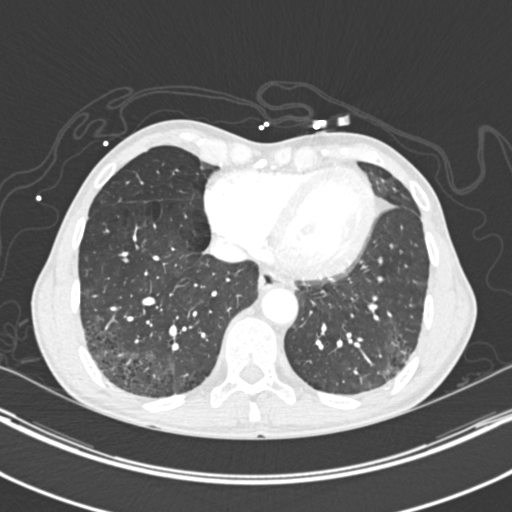
[im 91/302  mediastinal]
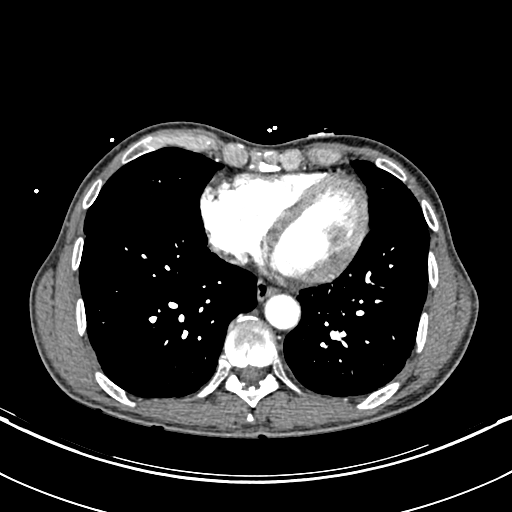
[im 106/302  lung]
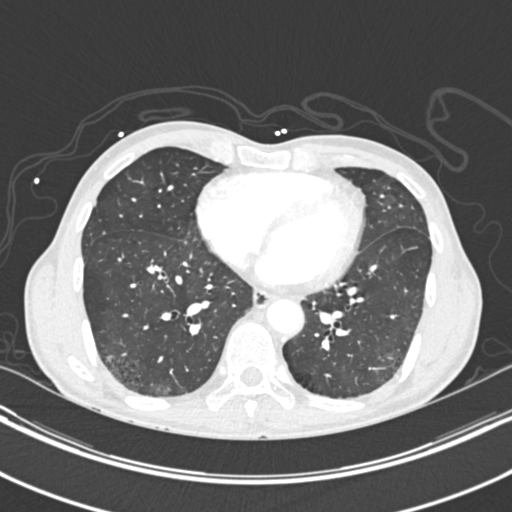
[im 121/302  mediastinal]
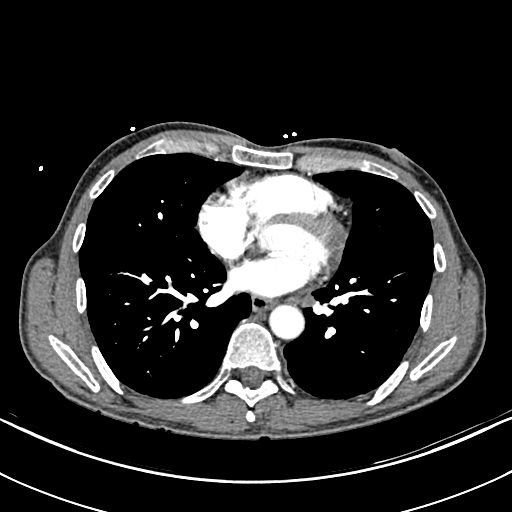
[im 136/302  lung]
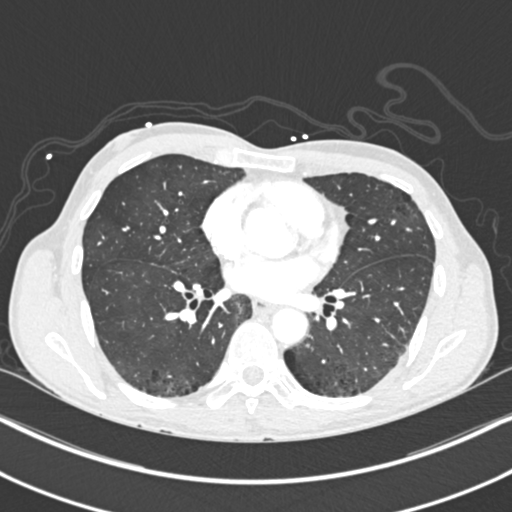
[im 166/302  mediastinal]
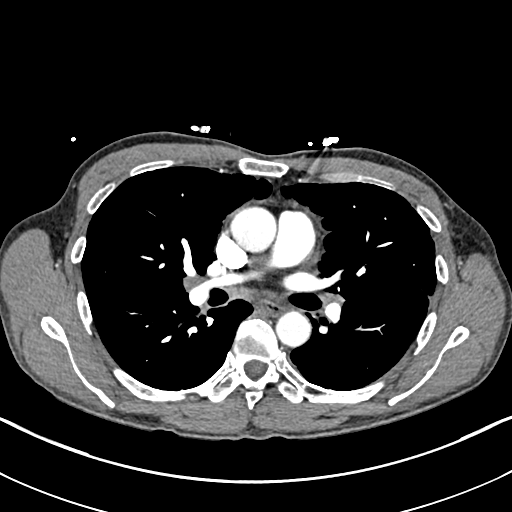
[im 181/302  lung]
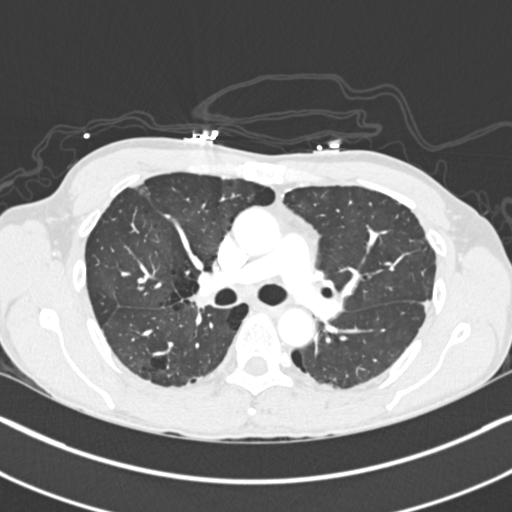
[im 196/302  mediastinal]
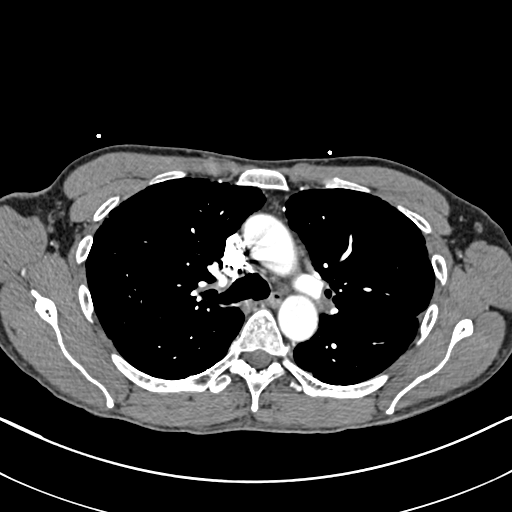
[im 211/302  lung]
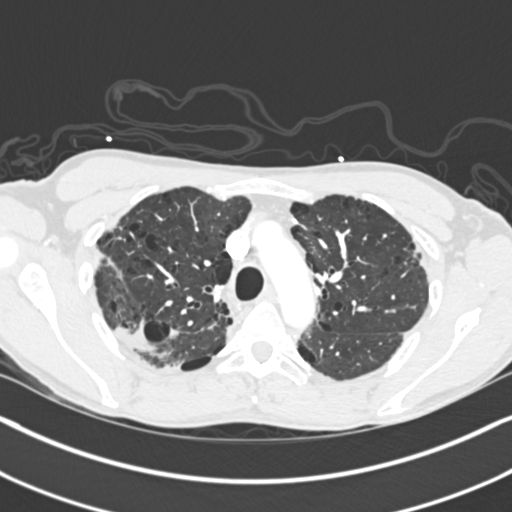
[im 226/302  mediastinal]
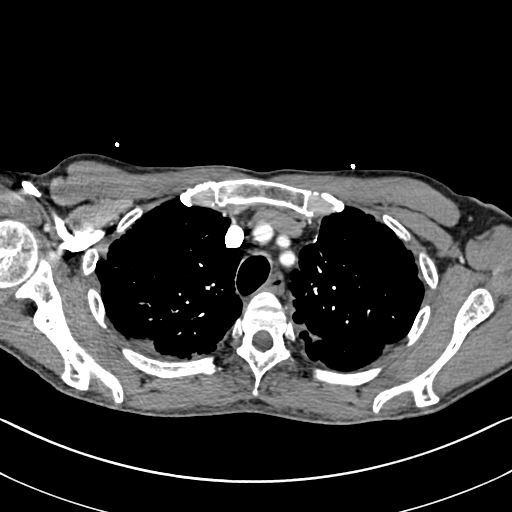
[im 241/302  lung]
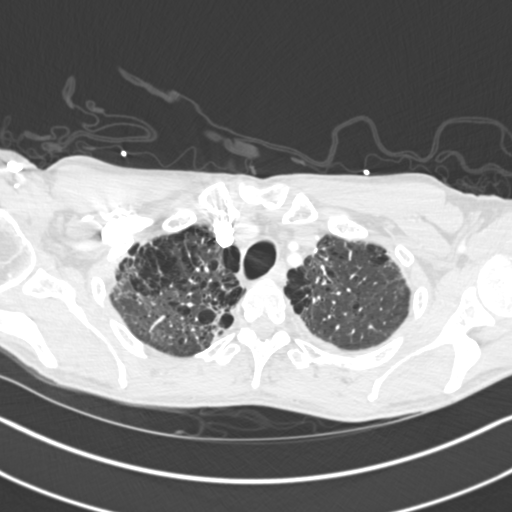
[im 256/302  mediastinal]
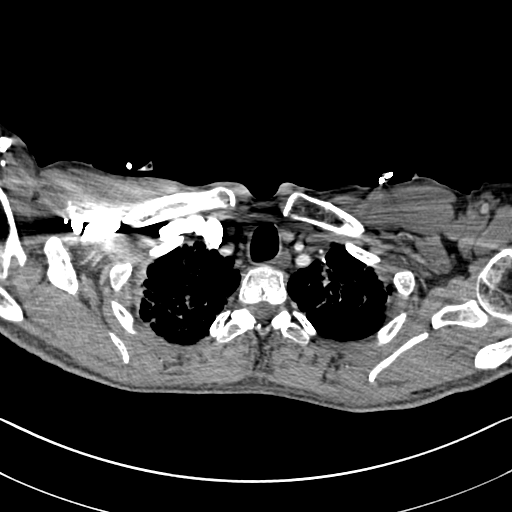
[im 271/302  lung]
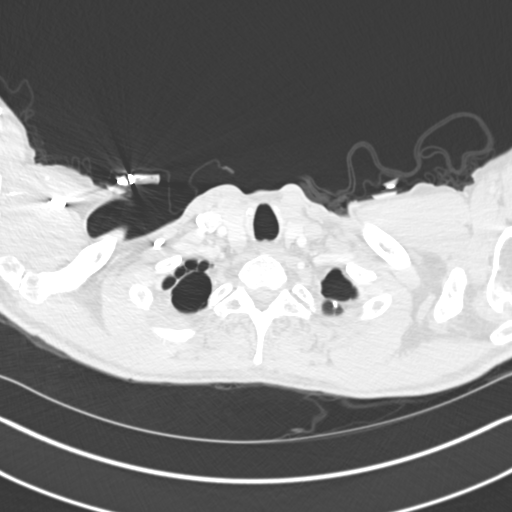
[im 286/302  mediastinal]
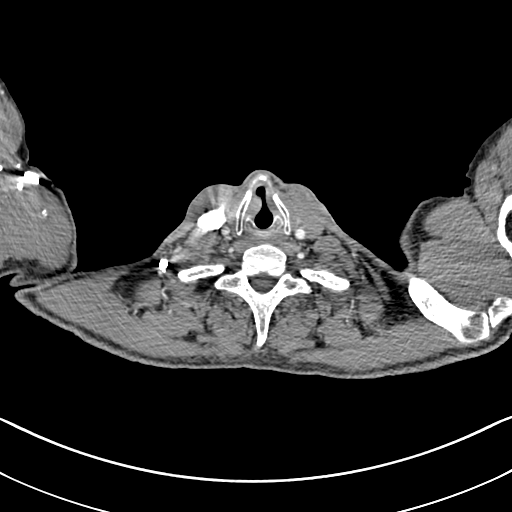

[Series 8: pe 2.0 coronal · coronal · 0.60mm/px · 1 of 103 slices shown]
[im 52/103  mediastinal]
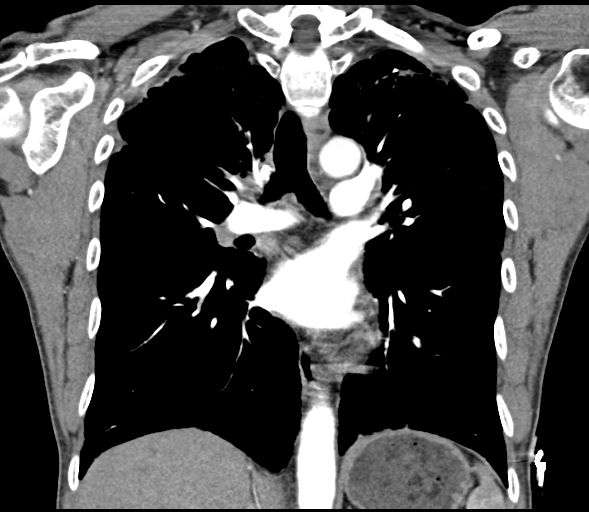

[19 of 36 positions shown; findings below may reference images not displayed]

FINDINGS: No pulmonary embolus is identified.  Heart size is
normal.  No pleural or pericardial effusion.  No axillary, hilar or
mediastinal lymphadenopathy.  Heart size normal.  Lungs demonstrate
extensive apical pleural parenchymal scarring.  Extensive
emphysematous change is also present.  No consolidative process,
pneumothorax or pulmonary nodule is identified.  Incidentally
imaged upper abdomen is unremarkable.  No focal bony abnormality.

Review of the MIP images confirms the above findings.
IMPRESSION: 1.  Negative for pulmonary embolus or acute finding.
2.  Extensive emphysematous disease and pleural or parenchymal
scar.

## 2012-04-11 ENCOUNTER — Emergency Department (HOSPITAL_BASED_OUTPATIENT_CLINIC_OR_DEPARTMENT_OTHER)
Admission: EM | Admit: 2012-04-11 | Discharge: 2012-04-11 | Disposition: A | Discharge status: Home / Self Care | Payer: Self-pay | Attending: Emergency Medicine | Admitting: Emergency Medicine

## 2012-04-11 ENCOUNTER — Emergency Department (HOSPITAL_BASED_OUTPATIENT_CLINIC_OR_DEPARTMENT_OTHER): Payer: Self-pay

## 2012-04-11 ENCOUNTER — Encounter (HOSPITAL_BASED_OUTPATIENT_CLINIC_OR_DEPARTMENT_OTHER): Payer: Self-pay | Admitting: *Deleted

## 2012-04-11 DIAGNOSIS — R05 Cough: Secondary | ICD-10-CM | POA: Insufficient documentation

## 2012-04-11 DIAGNOSIS — R0789 Other chest pain: Secondary | ICD-10-CM

## 2012-04-11 DIAGNOSIS — R071 Chest pain on breathing: Secondary | ICD-10-CM | POA: Insufficient documentation

## 2012-04-11 DIAGNOSIS — R0602 Shortness of breath: Secondary | ICD-10-CM | POA: Insufficient documentation

## 2012-04-11 DIAGNOSIS — J40 Bronchitis, not specified as acute or chronic: Secondary | ICD-10-CM | POA: Insufficient documentation

## 2012-04-11 DIAGNOSIS — R059 Cough, unspecified: Secondary | ICD-10-CM | POA: Insufficient documentation

## 2012-04-11 MED ORDER — ALBUTEROL SULFATE (5 MG/ML) 0.5% IN NEBU
5.0000 mg | INHALATION_SOLUTION | Freq: Once | RESPIRATORY_TRACT | Status: AC
  Administered 2012-04-11: 5 mg via RESPIRATORY_TRACT
  Filled 2012-04-11: qty 1

## 2012-04-11 MED ORDER — PREDNISONE 50 MG PO TABS
60.0000 mg | ORAL_TABLET | Freq: Once | ORAL | Status: AC
  Administered 2012-04-11: 60 mg via ORAL
  Filled 2012-04-11: qty 1

## 2012-04-11 MED ORDER — OXYCODONE-ACETAMINOPHEN 5-325 MG PO TABS
1.0000 | ORAL_TABLET | Freq: Once | ORAL | Status: AC
  Administered 2012-04-11: 1 via ORAL
  Filled 2012-04-11 (×2): qty 1

## 2012-04-11 MED ORDER — PREDNISONE 20 MG PO TABS: 40.0000 mg | ORAL_TABLET | Freq: Every day | ORAL | Status: DC

## 2012-04-11 MED ORDER — IPRATROPIUM BROMIDE 0.02 % IN SOLN
0.5000 mg | Freq: Once | RESPIRATORY_TRACT | Status: AC
  Administered 2012-04-11: 0.5 mg via RESPIRATORY_TRACT
  Filled 2012-04-11: qty 2.5

## 2012-04-11 MED ORDER — OXYCODONE-ACETAMINOPHEN 5-325 MG PO TABS: 1.0000 | ORAL_TABLET | Freq: Four times a day (QID) | ORAL | Status: DC | PRN

## 2012-04-11 MED ORDER — ALBUTEROL SULFATE HFA 108 (90 BASE) MCG/ACT IN AERS
2.0000 | INHALATION_SPRAY | RESPIRATORY_TRACT | Status: DC | PRN
  Administered 2012-04-11: 2 via RESPIRATORY_TRACT
  Filled 2012-04-11: qty 6.7

## 2012-04-11 MED ORDER — AZITHROMYCIN 250 MG PO TABS: 250.0000 mg | ORAL_TABLET | Freq: Every day | ORAL | Status: DC

## 2012-04-11 NOTE — ED Provider Notes (Addendum)
History     CSN: [id number]  Arrival date & time 04/11/12  1335   First MD Initiated Contact with Richard [name] 04/11/12 1409      Chief Complaint  Richard [name] presents with  . Shortness of Breath    (Consider location/radiation/quality/duration/timing/severity/associated sxs/prior treatment) Richard [name] is a 41 y.o. male presenting with shortness of breath and cough. The history is provided by the Richard [name].  Shortness of Breath  The current episode started 2 days ago. The onset was gradual. The problem occurs continuously. The problem has been gradually worsening. The problem is moderate. The symptoms are relieved by rest. The symptoms are aggravated by activity. Associated symptoms include chest pain, cough and shortness of breath. Pertinent negatives include no wheezing. He has had no prior steroid use. Past medical history comments: prior hx of PTX x2 requiring left sided chest tube. Urine output has been normal. The last void occurred less than 6 hours ago. There were no sick contacts. He has received no recent medical care.  Cough This is a new problem. The current episode started 2 days ago. The problem occurs constantly. The problem has been gradually worsening. The cough is productive of sputum. Maximum temperature: fever yesterday but unknown what temp was. Associated symptoms include chest pain and shortness of breath. Pertinent negatives include no wheezing. He has tried nothing for the symptoms. He is a smoker. Past medical history comments: prior hx of PTX x2 requiring left sided chest tube.    Past Medical History  Diagnosis Date  . Spontaneous pneumothorax   . Kidney calculi   . COPD (chronic obstructive pulmonary disease)     History reviewed. No pertinent past surgical history.  History reviewed. No pertinent family history.  History  Substance Use Topics  . Smoking status: Current Every Day Smoker -- 1.0 packs/day  . Smokeless tobacco: Not on file  . Alcohol Use: No       Review of Systems  Respiratory: Positive for cough and shortness of breath. Negative for wheezing.        Chest pain is 9/10 and radiates around to the left back  Cardiovascular: Positive for chest pain.  All other systems reviewed and are negative.    Allergies  Review of Richard [name]'s allergies indicates no known allergies.  Home Medications  No current outpatient prescriptions on file.  BP 158/108  Pulse 94  Temp 98 F (36.7 C) (Oral)  Resp 24  Ht 5\' 7"  (1.702 m)  Wt 128 lb (58.06 kg)  BMI 20.05 kg/m2  SpO2 100%  Physical Exam  Nursing note and vitals reviewed. Constitutional: He is oriented to person, place, and time. He appears well-developed and well-nourished. No distress.  HENT:  Head: Normocephalic and atraumatic.  Mouth/Throat: Oropharynx is clear and moist.  Eyes: Conjunctivae normal and EOM are normal. Pupils are equal, round, and reactive to light.  Neck: Normal range of motion. Neck supple.  Cardiovascular: Normal rate, regular rhythm and intact distal pulses.   No murmur heard. Pulmonary/Chest: Tachypnea noted. No respiratory distress. He has no wheezes. He has rhonchi in the left lower field. He has no rales.   He exhibits tenderness. He exhibits no bony tenderness, no crepitus and no retraction.  Abdominal: Soft. He exhibits no distension. There is no tenderness. There is no rebound and no guarding.  Musculoskeletal: Normal range of motion. He exhibits no edema and no tenderness.  Neurological: He is alert and oriented to person, place, and time.  Skin: Skin is warm and dry. No  rash noted. No erythema.  Psychiatric: He has a normal mood and affect. His behavior is normal.    ED Course  Procedures (including critical care time)  Labs Reviewed - No data to display Dg Chest 2 View  04/11/2012  *RADIOLOGY REPORT*  Clinical Data: Shortness of breath, chest pain  CHEST - 2 VIEW  Comparison: 10/16/2011  Findings: Right upper lobe scarring/fibrosis.   Biapical pleural parenchymal scarring.  Postsurgical changes in the left lung apex. No definite superimposed acute opacities.  No pleural effusion or pneumothorax.  Cardiomediastinal silhouette is within normal limits.  Visualized osseous structures are within normal limits.  IMPRESSION: No evidence of acute cardiopulmonary disease.  Bilateral upper lobe scarring/fibrosis, right greater than left.  Postsurgical changes in the left lung apex.   Original Report Authenticated By: Charline Bills, M.D.      No diagnosis found.    MDM   Richard [name] with a productive cough for the last 2 days as well as shortness of breath and left-sided width and back pain. Richard [name] has a history of collapsed lung x2 requiring VATS procedure initially and then a chest tube the second time. He states he is only short of breath with exertion and does still continue to smoke. He does not use inhalers on a regular basis. He is afebrile now but states he did have a fever yesterday. On exam Richard [name] has bilateral breath sounds but does have rhonchi in the left lower lobe. Satting 100% on room air but is slightly tachypneic. Chest x-ray pending.  2:59 PM Plain film of the chest neg for acute pathology.  Most likely bronchitis and COPD exacerbation.  Pt given albuterol/atrovent and steroids.  Pain is most likely exacerbation of his chronic pain from the surgery in the past.  No hx suggestive of PE.  3:43 PM Pt feeling better after meds.  Will d/c home.      Gwyneth Sprout, MD 04/11/12 1500  Gwyneth Sprout, MD 04/11/12 [phone]

## 2012-04-11 NOTE — ED Notes (Signed)
Pt states he has had increased SHOB since last p.m. Pain left side back. ?pleural friction rub on left. Hx collapsed lung 1-1/2 years ago x 2 Sats 100% at triage

## 2012-10-14 ENCOUNTER — Encounter (HOSPITAL_BASED_OUTPATIENT_CLINIC_OR_DEPARTMENT_OTHER): Payer: Self-pay

## 2012-10-14 ENCOUNTER — Emergency Department (HOSPITAL_BASED_OUTPATIENT_CLINIC_OR_DEPARTMENT_OTHER)
Admission: EM | Admit: 2012-10-14 | Discharge: 2012-10-14 | Disposition: A | Discharge status: Home / Self Care | Payer: Self-pay | Attending: Emergency Medicine | Admitting: Emergency Medicine

## 2012-10-14 ENCOUNTER — Emergency Department (HOSPITAL_BASED_OUTPATIENT_CLINIC_OR_DEPARTMENT_OTHER): Payer: Self-pay

## 2012-10-14 DIAGNOSIS — J449 Chronic obstructive pulmonary disease, unspecified: Secondary | ICD-10-CM | POA: Insufficient documentation

## 2012-10-14 DIAGNOSIS — J4489 Other specified chronic obstructive pulmonary disease: Secondary | ICD-10-CM | POA: Insufficient documentation

## 2012-10-14 DIAGNOSIS — N200 Calculus of kidney: Secondary | ICD-10-CM | POA: Insufficient documentation

## 2012-10-14 DIAGNOSIS — Z8709 Personal history of other diseases of the respiratory system: Secondary | ICD-10-CM | POA: Insufficient documentation

## 2012-10-14 DIAGNOSIS — R110 Nausea: Secondary | ICD-10-CM | POA: Insufficient documentation

## 2012-10-14 DIAGNOSIS — F17200 Nicotine dependence, unspecified, uncomplicated: Secondary | ICD-10-CM | POA: Insufficient documentation

## 2012-10-14 DIAGNOSIS — R300 Dysuria: Secondary | ICD-10-CM | POA: Insufficient documentation

## 2012-10-14 DIAGNOSIS — N509 Disorder of male genital organs, unspecified: Secondary | ICD-10-CM | POA: Insufficient documentation

## 2012-10-14 LAB — URINALYSIS, ROUTINE W REFLEX MICROSCOPIC
Glucose, UA: NEGATIVE mg/dL
Leukocytes, UA: NEGATIVE
Specific Gravity, Urine: 1.023 (ref 1.005–1.030)
pH: 6.5 (ref 5.0–8.0)

## 2012-10-14 LAB — CBC WITH DIFFERENTIAL/PLATELET
Eosinophils Absolute: 0.1 10*3/uL (ref 0.0–0.7)
Eosinophils Relative: 1 % (ref 0–5)
Hemoglobin: 12.7 g/dL — ABNORMAL LOW (ref 13.0–17.0)
Lymphocytes Relative: 18 % (ref 12–46)
Lymphs Abs: 1.9 10*3/uL (ref 0.7–4.0)
MCH: 31.1 pg (ref 26.0–34.0)
MCV: 90.2 fL (ref 78.0–100.0)
Monocytes Relative: 5 % (ref 3–12)
Neutrophils Relative %: 76 % (ref 43–77)
Platelets: 295 10*3/uL (ref 150–400)
RBC: 4.08 MIL/uL — ABNORMAL LOW (ref 4.22–5.81)
WBC: 10.3 10*3/uL (ref 4.0–10.5)

## 2012-10-14 LAB — BASIC METABOLIC PANEL
CO2: 24 mEq/L (ref 19–32)
Chloride: 103 mEq/L (ref 96–112)
Glucose, Bld: 95 mg/dL (ref 70–99)
Potassium: 3.8 mEq/L (ref 3.5–5.1)
Sodium: 139 mEq/L (ref 135–145)

## 2012-10-14 LAB — URINE MICROSCOPIC-ADD ON

## 2012-10-14 MED ORDER — OXYCODONE-ACETAMINOPHEN 5-325 MG PO TABS: 1.0000 | ORAL_TABLET | ORAL | Status: DC | PRN

## 2012-10-14 MED ORDER — ONDANSETRON HCL 4 MG/2ML IJ SOLN
4.0000 mg | Freq: Once | INTRAMUSCULAR | Status: AC
  Administered 2012-10-14: 4 mg via INTRAVENOUS
  Filled 2012-10-14: qty 2

## 2012-10-14 MED ORDER — ONDANSETRON HCL 4 MG PO TABS: 4.0000 mg | ORAL_TABLET | Freq: Three times a day (TID) | ORAL | Status: DC | PRN

## 2012-10-14 MED ORDER — MORPHINE SULFATE 2 MG/ML IJ SOLN
1.0000 mg | Freq: Once | INTRAMUSCULAR | Status: AC
  Administered 2012-10-14: 1 mg via INTRAVENOUS
  Filled 2012-10-14: qty 1

## 2012-10-14 MED ORDER — MORPHINE SULFATE 2 MG/ML IJ SOLN
2.0000 mg | Freq: Once | INTRAMUSCULAR | Status: AC
  Administered 2012-10-14: 2 mg via INTRAVENOUS
  Filled 2012-10-14: qty 1

## 2012-10-14 NOTE — ED Notes (Signed)
Patient transported to Ultrasound 

## 2012-10-14 NOTE — ED Provider Notes (Signed)
History     CSN: [id number]  Arrival date & time 10/14/12  1004   First MD Initiated Contact with Patient 10/14/12 1024      Chief Complaint  Patient presents with  . Abdominal Pain  . Dysuria  . Testicle Pain    (Consider location/radiation/quality/duration/timing/severity/associated sxs/prior treatment) HPI Patient reports feeling fine until yesterday afternoon at which time he starting having some right sided flank pain.  Developed nausea without emesis last night.  Woken by left testicular pain around 3 or 4 this morning.  He reports a history of multiple kidney stones and this feels similar, but he usually has back pain as well which he does not have today.  Also with dysuria.  Had similar testicular pain last summer that lasted about 5 days.  He states he did not get evaluated at that time, but there are records from last July when a scrotal ultrasound and GC/Chlamydia was done and were all normal.  No diarrhea, fevers or chills.  Denies any new sexual partners.  Past Medical History  Diagnosis Date  . Spontaneous pneumothorax   . Kidney calculi   . COPD (chronic obstructive pulmonary disease)     Past Surgical History  Procedure Laterality Date  . Cystoscopy    . Lithotripsy      No family history on file.  History  Substance Use Topics  . Smoking status: Current Every Day Smoker -- 1.00 packs/day  . Smokeless tobacco: Not on file  . Alcohol Use: No      Review of Systems  Constitutional: Negative for fever and chills.  HENT: Negative.   Eyes: Negative.   Respiratory: Negative.   Cardiovascular: Negative.   Gastrointestinal: Positive for nausea and abdominal pain (right flank and lower quadrant). Negative for vomiting and diarrhea.  Endocrine: Negative.   Genitourinary: Positive for dysuria, flank pain and testicular pain (left). Negative for hematuria, scrotal swelling and penile pain.  Musculoskeletal: Negative.   Skin: Negative.   Neurological:  Negative.     Allergies  Review of patient's allergies indicates no known allergies.  Home Medications  No current outpatient prescriptions on file.  BP 175/113  Pulse 90  Temp(Src) 98.4 F (36.9 C) (Oral)  Resp 18  Ht 5\' 7"  (1.702 m)  Wt 128 lb (58.06 kg)  BMI 20.04 kg/m2  SpO2 99%  Physical Exam  Constitutional: He is oriented to person, place, and time. He appears well-developed and well-nourished. He appears distressed.  HENT:  Head: Normocephalic and atraumatic.  Neck: Normal range of motion. Neck supple.  Pulmonary/Chest: Effort normal. No respiratory distress.  Abdominal: Soft. Bowel sounds are normal. He exhibits no distension. There is tenderness (RLQ). There is no rebound and no guarding.  Genitourinary: Penis normal. Right testis shows no swelling and no tenderness. Left testis shows tenderness. Left testis shows no swelling. Circumcised.  No high-riding testicle  Musculoskeletal: He exhibits no edema.  Lymphadenopathy:       Right: No inguinal adenopathy present.       Left: No inguinal adenopathy present.  Neurological: He is alert and oriented to person, place, and time. He exhibits normal muscle tone.  Skin: Skin is warm and dry. No rash noted. He is not diaphoretic. No erythema.  Psychiatric: He has a normal mood and affect. Thought content normal.    ED Course  Procedures (including critical care time)  Labs Reviewed  URINALYSIS, ROUTINE W REFLEX MICROSCOPIC - Abnormal; Notable for the following:    Hgb urine dipstick  SMALL (*)    All other components within normal limits  CBC WITH DIFFERENTIAL - Abnormal; Notable for the following:    RBC 4.08 (*)    Hemoglobin 12.7 (*)    HCT 36.8 (*)    Neutro Abs 7.8 (*)    All other components within normal limits  URINE MICROSCOPIC-ADD ON - Abnormal; Notable for the following:    Squamous Epithelial / LPF FEW (*)    Bacteria, UA FEW (*)    All other components within normal limits  GC/CHLAMYDIA PROBE AMP   GC/CHLAMYDIA PROBE AMP  BASIC METABOLIC PANEL   Ct Abdomen Pelvis Wo Contrast  10/14/2012  *RADIOLOGY REPORT*  Clinical Data: Abdominal pain and dysuria.  CT ABDOMEN AND PELVIS WITHOUT CONTRAST  Technique:  Multidetector CT imaging of the abdomen and pelvis was performed following the standard protocol without intravenous contrast.  Comparison: 10/16/2011 and 06/10/2011.  Findings: The chronic interstitial changes in the lungs are are not substantially changed.  No focal abnormalities seen in the liver or spleen on this study performed without intravenous contrast material.  The stomach, duodenum, pancreas, gallbladder, adrenal glands, and abdominal bowel loops are unremarkable. Gallbladder has normal imaging features.  Multiple stones are seen in both kidneys without hydronephrosis on either side.  At least seven stones are seen in the right kidney and largest is in the interpolar region measuring 3 x 6 mm.  At least four stones are seen in the left kidney with the largest measuring 8 x 6 mm in the interpolar region.  There is no evidence for ureteral or bladder stones.  No abdominal aortic aneurysm.  There is no free fluid or lymphadenopathy in the abdomen.  Imaging through the pelvis shows no free intraperitoneal fluid. There is no pelvic sidewall lymphadenopathy.  No evidence for colonic diverticulitis.  The terminal ileum is normal. The appendix is not visualized, but there is no edema or inflammation in the region of the cecum.  The appendix is normal.  Bone windows reveal no worrisome lytic or sclerotic osseous lesions.  IMPRESSION: Bilateral nonobstructing renal stones.  No secondary changes in either kidney or ureter.   Original Report Authenticated By: Kennith Center, M.D.    US Scrotum  10/14/2012  *RADIOLOGY REPORT*  Clinical Data: Left testicular pain, right lower quadrant pain  ULTRASOUND OF SCROTUM  Technique:  Complete ultrasound examination of the testicles, epididymis, and other scrotal  structures was performed.  Comparison:  01/15/2012  Findings:  Right testis:  Measures 4.8 x 2.9 x 3 cm.  Normal echogenicity.  No focal mass.  Normal arterial and venous waveform.  Normal cord Doppler flow.  Left testis:  Measures 4.5 x 2 x 2.9 cm.  Normal flow and echogenicity.  Normal flow arterial and venous waveform.  Right epididymis:  Normal in size and appearance.  Left epididymis:  Normal in size and appearance.  Hydrocele:  Absent  Varicocele: Absent  IMPRESSION: Unremarkable testicular ultrasound.  No evidence of torsion.  No varicocele or hydrocele.   Original Report Authenticated By: Natasha Mead, M.D.    [place] Art/ven Flow Abd Pelv Doppler  10/14/2012  *RADIOLOGY REPORT*  Clinical Data: Left testicular pain, right lower quadrant pain  ULTRASOUND OF SCROTUM  Technique:  Complete ultrasound examination of the testicles, epididymis, and other scrotal structures was performed.  Comparison:  01/15/2012  Findings:  Right testis:  Measures 4.8 x 2.9 x 3 cm.  Normal echogenicity.  No focal mass.  Normal arterial and venous waveform.  Normal  cord Doppler flow.  Left testis:  Measures 4.5 x 2 x 2.9 cm.  Normal flow and echogenicity.  Normal flow arterial and venous waveform.  Right epididymis:  Normal in size and appearance.  Left epididymis:  Normal in size and appearance.  Hydrocele:  Absent  Varicocele: Absent  IMPRESSION: Unremarkable testicular ultrasound.  No evidence of torsion.  No varicocele or hydrocele.   Original Report Authenticated By: Natasha Mead, M.D.      1. Renal stones       MDM  42 yo M with history of multiple kidney stones presenting with right flank/RLQ pain, left testicle pain and nausea.  Concern for kidney stone vs epididymitis.  Testicular torsion would be unusual at this age but will check scrotal ultrasound.  Blood on UA supports possibility of kidney stone, will check renal ultrasound for obstruction.  Will also get ureteral swab for GC/Chlamydia.  Labs remarkable for  hematuria only.  Imaging negative for testicular torsion, no evidence for GU infection on UA, no evidence for obstruction kidney stone on CT.  Pain is much better after the morphine.  Discussed with patient.  He has seen Dr. Gerome Sam in Urology previously.  Patient will make an appointment to follow up with him.  Will discharge with some percocet and zofran.  Discussed reasons to return including fever, worsening pain, and gross hematuria.  He was agreeable with this plan.  BOOTH, Cortasia Screws 10/14/2012, 12:57 PM       Phebe Colla, MD 10/14/12 1257

## 2012-10-14 NOTE — ED Notes (Signed)
Pt reports onset of RLQ pain that started yesterday and developed left testicular pain this am.  He also reports painful urination.

## 2012-10-15 LAB — GC/CHLAMYDIA PROBE AMP
CT Probe RNA: NEGATIVE
GC Probe RNA: NEGATIVE

## 2012-10-16 NOTE — ED Provider Notes (Signed)
I saw the patient, reviewed the resident's note and I agree with the findings and plan.   .Face to face Exam:  General:  Awake HEENT:  Atraumatic Resp:  Normal effort Abd:  Nondistended Neuro:No focal weakness   Linkyn Gobin L Diron Haddon, MD 10/16/12 1129 

## 2012-11-07 ENCOUNTER — Emergency Department (HOSPITAL_BASED_OUTPATIENT_CLINIC_OR_DEPARTMENT_OTHER): Payer: Self-pay

## 2012-11-07 ENCOUNTER — Emergency Department (HOSPITAL_BASED_OUTPATIENT_CLINIC_OR_DEPARTMENT_OTHER)
Admission: EM | Admit: 2012-11-07 | Discharge: 2012-11-08 | Disposition: A | Discharge status: Home / Self Care | Payer: Self-pay | Attending: Emergency Medicine | Admitting: Emergency Medicine

## 2012-11-07 ENCOUNTER — Encounter (HOSPITAL_BASED_OUTPATIENT_CLINIC_OR_DEPARTMENT_OTHER): Payer: Self-pay | Admitting: *Deleted

## 2012-11-07 DIAGNOSIS — J9383 Other pneumothorax: Secondary | ICD-10-CM | POA: Insufficient documentation

## 2012-11-07 DIAGNOSIS — J441 Chronic obstructive pulmonary disease with (acute) exacerbation: Secondary | ICD-10-CM | POA: Insufficient documentation

## 2012-11-07 DIAGNOSIS — J939 Pneumothorax, unspecified: Secondary | ICD-10-CM

## 2012-11-07 DIAGNOSIS — F17200 Nicotine dependence, unspecified, uncomplicated: Secondary | ICD-10-CM | POA: Insufficient documentation

## 2012-11-07 DIAGNOSIS — Z87442 Personal history of urinary calculi: Secondary | ICD-10-CM | POA: Insufficient documentation

## 2012-11-07 DIAGNOSIS — Z9889 Other specified postprocedural states: Secondary | ICD-10-CM | POA: Insufficient documentation

## 2012-11-07 DIAGNOSIS — R079 Chest pain, unspecified: Secondary | ICD-10-CM | POA: Insufficient documentation

## 2012-11-07 MED ORDER — OXYCODONE-ACETAMINOPHEN 7.5-325 MG PO TABS: 1.0000 | ORAL_TABLET | ORAL | Status: DC | PRN

## 2012-11-07 MED ORDER — OXYCODONE-ACETAMINOPHEN 5-325 MG PO TABS
2.0000 | ORAL_TABLET | Freq: Once | ORAL | Status: AC
  Administered 2012-11-07: 2 via ORAL
  Filled 2012-11-07 (×2): qty 2

## 2012-11-07 MED ORDER — HYDROMORPHONE HCL PF 1 MG/ML IJ SOLN
1.0000 mg | Freq: Once | INTRAMUSCULAR | Status: AC
  Administered 2012-11-07: 1 mg via INTRAVENOUS
  Filled 2012-11-07: qty 1

## 2012-11-07 MED ORDER — ONDANSETRON HCL 4 MG/2ML IJ SOLN
4.0000 mg | Freq: Once | INTRAMUSCULAR | Status: AC
  Administered 2012-11-07: 4 mg via INTRAVENOUS
  Filled 2012-11-07: qty 2

## 2012-11-07 NOTE — ED Notes (Signed)
Pt reports in left ribs- states unable to get full breath- reports hx of collapsed lung on left x 2

## 2012-11-07 NOTE — ED Notes (Signed)
Pt asleep.

## 2012-11-07 NOTE — ED Provider Notes (Signed)
History     CSN: [id number]  Arrival date & time 11/07/12  1813   First MD Initiated Contact with Patient 11/07/12 1904      Chief Complaint  Patient presents with  . Shortness of Breath    (Consider location/radiation/quality/duration/timing/severity/associated sxs/prior treatment) Patient is a 42 y.o. male presenting with shortness of breath. The history is provided by the patient. No language interpreter was used.  Shortness of Breath Severity:  Severe Onset quality:  Sudden Duration:  4 hours Timing:  Constant Progression:  Worsening Chronicity:  New Relieved by:  Nothing Worsened by:  Nothing tried Ineffective treatments:  None tried Associated symptoms: chest pain   Risk factors comment:  Pneumothorax x 2   Past Medical History  Diagnosis Date  . Spontaneous pneumothorax   . Kidney calculi   . COPD (chronic obstructive pulmonary disease)     Past Surgical History  Procedure Laterality Date  . Cystoscopy    . Lithotripsy    . Lung surgery      No family history on file.  History  Substance Use Topics  . Smoking status: Current Every Day Smoker -- 1.00 packs/day    Types: Cigarettes  . Smokeless tobacco: Never Used  . Alcohol Use: No      Review of Systems  Respiratory: Positive for shortness of breath.   Cardiovascular: Positive for chest pain.  All other systems reviewed and are negative.    Allergies  Review of patient's allergies indicates no known allergies.  Home Medications   Current Outpatient Rx  Name  Route  Sig  Dispense  Refill  . ondansetron (ZOFRAN) 4 MG tablet   Oral   Take 1 tablet (4 mg total) by mouth every 8 (eight) hours as needed for nausea.   20 tablet   0   . oxyCODONE-acetaminophen (ROXICET) 5-325 MG per tablet   Oral   Take 1-2 tablets by mouth every 4 (four) hours as needed for pain.   30 tablet   0     BP 138/92  Pulse 82  Temp(Src) 98.8 F (37.1 C) (Oral)  Resp 13  Ht 5\' 7"  (1.702 m)  Wt 128 lb  (58.06 kg)  BMI 20.04 kg/m2  SpO2 100%  Physical Exam  Nursing note and vitals reviewed. Constitutional: He appears well-developed and well-nourished.  HENT:  Head: Normocephalic and atraumatic.  Right Ear: External ear normal.  Left Ear: External ear normal.  Nose: Nose normal.  Mouth/Throat: Oropharynx is clear and moist.  Eyes: Pupils are equal, round, and reactive to light.  Neck: Normal range of motion.  Cardiovascular: Normal rate and normal heart sounds.   Pulmonary/Chest: Effort normal and breath sounds normal.  Abdominal: Soft.  Neurological: He is alert.  Skin: Skin is warm.  Psychiatric: He has a normal mood and affect.    ED Course  Procedures (including critical care time)  Labs Reviewed - No data to display Dg Chest 2 View  11/07/2012  *RADIOLOGY REPORT*  Clinical Data: Shortness of breath.  Previous pneumothorax.  CHEST - 2 VIEW  Comparison: 11/07/2012 at 1916 hours.  Findings: Normal heart size and pulmonary vascularity. Emphysematous changes in the lungs.  There is prominent fibrosis in the upper lungs with scattered fibrosis throughout the lungs. Lucency again demonstrated in the left lung base consistent with pneumothorax and stable since previous study.  IMPRESSION: Stable appearance of the chest since earlier today.  Lucency at the left lung base consistent with small pneumothorax.  Emphysematous changes  and fibrosis in the lungs.   Original Report Authenticated By: Burman Nieves, M.D.    Dg Chest 2 View  11/07/2012  *RADIOLOGY REPORT*  Clinical Data: Shortness of breath, left-sided chest pain.  The patient states the pain is similar to previous pneumothorax.  CHEST - 2 VIEW  Comparison: Chest CT 10/14/2012, chest radiograph 04/11/2012  Findings: There is new curvilinear lucency at the left lung base that could represent a small subpulmonic pneumothorax.  Diffuse severe emphysematous changes noted with biapical scarring.  No pleural effusion.  Heart size is  normal.  No new bony abnormality. No mediastinal shift.  IMPRESSION: New curvilinear lucency at the left lung base which could indicate interval development of a small subpulmonic pneumothorax.  However, the patient has had rarefaction of lung markings in this area due to severe emphysematous change, and this could mimic the current appearance, although this appears different from prior exams.  Critical Value/emergent results were called by telephone at the time of interpretation on 11/07/2012 at 7:05 p.m. to Dr. Manus Gunning, who verbally acknowledged these results.   Original Report Authenticated By: Christiana Pellant, M.D.      1. Pneumothorax       MDM  Dr. Manus Gunning spoke with Dr. Laneta Simmers who advised observe and repeat chest xray.   Chest xray repeated at 9pm.   No change.   Pt is feeling better,  02 sats 100%   Pt given rx for percocet.   Pt advised to see Dr. Laneta Simmers on Monday for recheck.  He is to return if any increased shortness of breath.   Pt advised to quit smoking.  I gave referral to Dr. Delford [facility].   Pt also given primary care referral list.       Elson Areas, PA-C 11/08/12 0009

## 2012-11-07 NOTE — ED Notes (Signed)
Transported to xray 

## 2012-11-08 NOTE — ED Provider Notes (Signed)
Medical screening examination/treatment/procedure(s) were conducted as a shared visit with non-physician practitioner(s) and myself.  I personally evaluated the patient during the encounter  Hx COPD, PTX x 2 with sudden onset mid left back pain and SOB.  No hypoxia, no distress. Equal breath sounds. BP 138/92  Pulse 82  Temp(Src) 98.8 F (37.1 C) (Oral)  Resp 13  Ht 5\' 7"  (1.702 m)  Wt 128 lb (58.06 kg)  BMI 20.04 kg/m2  SpO2 100%  Subpulmonic PTX seen on CXR d/w Dr. Laneta Simmers.  This has occurred on same side as previous VATs. Dr. Laneta Simmers does not think this needs intervention. No indication for chest tube.  Given patient's previous VATs, Dr. Laneta Simmers states it is unlikely that this will expand. He does not think patient needs admission.  Does advise repeat xray before discharge.  He wants to see patient in office on Monday.   Glynn Octave, MD 11/08/12 [phone]

## 2012-11-16 ENCOUNTER — Emergency Department (HOSPITAL_BASED_OUTPATIENT_CLINIC_OR_DEPARTMENT_OTHER): Payer: Self-pay

## 2012-11-16 ENCOUNTER — Emergency Department (HOSPITAL_BASED_OUTPATIENT_CLINIC_OR_DEPARTMENT_OTHER)
Admission: EM | Admit: 2012-11-16 | Discharge: 2012-11-16 | Disposition: A | Discharge status: Home / Self Care | Payer: Self-pay | Attending: Emergency Medicine | Admitting: Emergency Medicine

## 2012-11-16 ENCOUNTER — Encounter (HOSPITAL_BASED_OUTPATIENT_CLINIC_OR_DEPARTMENT_OTHER): Payer: Self-pay

## 2012-11-16 DIAGNOSIS — M549 Dorsalgia, unspecified: Secondary | ICD-10-CM | POA: Insufficient documentation

## 2012-11-16 DIAGNOSIS — F17200 Nicotine dependence, unspecified, uncomplicated: Secondary | ICD-10-CM | POA: Insufficient documentation

## 2012-11-16 DIAGNOSIS — J441 Chronic obstructive pulmonary disease with (acute) exacerbation: Secondary | ICD-10-CM | POA: Insufficient documentation

## 2012-11-16 DIAGNOSIS — Z720 Tobacco use: Secondary | ICD-10-CM

## 2012-11-16 DIAGNOSIS — Z9889 Other specified postprocedural states: Secondary | ICD-10-CM | POA: Insufficient documentation

## 2012-11-16 DIAGNOSIS — Z87442 Personal history of urinary calculi: Secondary | ICD-10-CM | POA: Insufficient documentation

## 2012-11-16 DIAGNOSIS — R071 Chest pain on breathing: Secondary | ICD-10-CM | POA: Insufficient documentation

## 2012-11-16 DIAGNOSIS — Z8709 Personal history of other diseases of the respiratory system: Secondary | ICD-10-CM | POA: Insufficient documentation

## 2012-11-16 DIAGNOSIS — R0789 Other chest pain: Secondary | ICD-10-CM

## 2012-11-16 LAB — BASIC METABOLIC PANEL
BUN: 15 mg/dL (ref 6–23)
CO2: 26 mEq/L (ref 19–32)
Calcium: 9.6 mg/dL (ref 8.4–10.5)
Chloride: 101 mEq/L (ref 96–112)
Creatinine, Ser: 0.8 mg/dL (ref 0.50–1.35)
GFR calc Af Amer: >90 mL/min (ref >90)
GFR calc non Af Amer: >90 mL/min (ref >90)
Glucose, Bld: 86 mg/dL (ref 70–99)
Potassium: 4.2 mEq/L (ref 3.5–5.1)
Sodium: 138 mEq/L (ref 135–145)

## 2012-11-16 LAB — CBC WITH DIFFERENTIAL/PLATELET
Basophils Absolute: 0.1 10*3/uL (ref 0.0–0.1)
Basophils Relative: 1 % (ref 0–1)
Eosinophils Absolute: 0.4 10*3/uL (ref 0.0–0.7)
Eosinophils Relative: 5 % (ref 0–5)
MCH: 31.2 pg (ref 26.0–34.0)
MCV: 91.4 fL (ref 78.0–100.0)
Neutrophils Relative %: 54 % (ref 43–77)
Platelets: 336 10*3/uL (ref 150–400)
RBC: 4.87 MIL/uL (ref 4.22–5.81)
RDW: 13.1 % (ref 11.5–15.5)

## 2012-11-16 LAB — TROPONIN I: Troponin I: <0.3 ng/mL (ref <0.30)

## 2012-11-16 MED ORDER — OXYCODONE-ACETAMINOPHEN 5-325 MG PO TABS: 1.0000 | ORAL_TABLET | Freq: Four times a day (QID) | ORAL | Status: DC | PRN

## 2012-11-16 MED ORDER — ASPIRIN 81 MG PO CHEW
324.0000 mg | CHEWABLE_TABLET | Freq: Once | ORAL | Status: AC
  Administered 2012-11-16: 324 mg via ORAL
  Filled 2012-11-16: qty 4

## 2012-11-16 MED ORDER — MORPHINE SULFATE 4 MG/ML IJ SOLN
4.0000 mg | Freq: Once | INTRAMUSCULAR | Status: AC
  Administered 2012-11-16: 4 mg via INTRAVENOUS
  Filled 2012-11-16: qty 1

## 2012-11-16 NOTE — ED Notes (Signed)
Pt reports a 10-12% pneumothorax on left lung that was diagnosed 1 week ago.  He reports increased pain with a deep breath through the week relieved with pain medications.  He ran out of pain medications Thursday.

## 2012-11-16 NOTE — ED Provider Notes (Signed)
History     CSN: [id number]  Arrival date & time 11/16/12  1043   First MD Initiated Contact with Patient 11/16/12 1052      Chief Complaint  Patient presents with  . Back Pain    (Consider location/radiation/quality/duration/timing/severity/associated sxs/prior treatment) HPI Comments: Patient reports that he has had pain on the left side of his back and pain of the left lateral chest that has been present for the past 1.5 weeks.  Pain has been constant.  Pain worse when taking a deep breath.  His pain today is associated with DOE and SOB.  He reports that the pain is gradually worsening.  He reports that Percocet did help the pain.  He was seen in the ED on 11/07/12 for similar symptoms and had a chest xray, which showed a small Pneumothorax.  He did not have a chest tube placed at that time.  He has a history of three spontaneous Pneumothorax's in the past.  In January 2012 he had Left VATS, left minithoracotomy with resection of apical blebs and pleurectomy performed.  He also has a history of COPD and currently smokes 1 ppd. He denies fever or chills.  He has had a productive cough for the past several weeks.  He denies wheezing.  Denies nausea, vomiting, or diaphoresis.  Denies numbness or tingling.  Denies dizziness, lightheadedness, or syncope.  He denies any prior cardiac history.  He denies prolonged travel or surgeries in the past 4 weeks.  Denies LE edema or pain.  Denies prior history of PE/DVT.  Denies hemoptysis.  Denies history of Cancer.  The history is provided by the patient.    Past Medical History  Diagnosis Date  . Spontaneous pneumothorax   . Kidney calculi   . COPD (chronic obstructive pulmonary disease)     Past Surgical History  Procedure Laterality Date  . Cystoscopy    . Lithotripsy    . Lung surgery      No family history on file.  History  Substance Use Topics  . Smoking status: Current Every Day Smoker -- 1.00 packs/day    Types: Cigarettes  .  Smokeless tobacco: Never Used  . Alcohol Use: No      Review of Systems  Constitutional: Negative for fever and chills.  Respiratory: Positive for cough and shortness of breath. Negative for wheezing.   Gastrointestinal: Negative for nausea and vomiting.  Musculoskeletal: Positive for back pain.  Neurological: Negative for syncope and numbness.  All other systems reviewed and are negative.    Allergies  Review of patient's allergies indicates no known allergies.  Home Medications   Current Outpatient Rx  Name  Route  Sig  Dispense  Refill  . ondansetron (ZOFRAN) 4 MG tablet   Oral   Take 1 tablet (4 mg total) by mouth every 8 (eight) hours as needed for nausea.   20 tablet   0   . oxyCODONE-acetaminophen (PERCOCET) 7.5-325 MG per tablet   Oral   Take 1 tablet by mouth every 4 (four) hours as needed for pain.   20 tablet   0   . oxyCODONE-acetaminophen (ROXICET) 5-325 MG per tablet   Oral   Take 1-2 tablets by mouth every 4 (four) hours as needed for pain.   30 tablet   0     BP 141/95  Pulse 87  Temp(Src) 97.8 F (36.6 C) (Oral)  Resp 22  SpO2 100%  Physical Exam  Nursing note and vitals reviewed. Constitutional: He  appears well-developed and well-nourished. No distress.  HENT:  Head: Normocephalic and atraumatic.  Mouth/Throat: Oropharynx is clear and moist.  Neck: Normal range of motion. Neck supple.  Cardiovascular: Normal rate, regular rhythm and normal heart sounds.   Pulmonary/Chest: Effort normal and breath sounds normal. No accessory muscle usage. Not tachypneic. No respiratory distress. He has no wheezes. He has no rales. He exhibits tenderness.  Left chest wall tenderness to palpation  Musculoskeletal: Normal range of motion.  No LE edema, erythema, or palpable cords. Negative Homan's sign bilaterally.  Neurological: He is alert.  Skin: Skin is warm and dry. He is not diaphoretic.  Psychiatric: He has a normal mood and affect.    ED Course   Procedures (including critical care time)  Labs Reviewed  TROPONIN I  CBC WITH DIFFERENTIAL  BASIC METABOLIC PANEL   Dg Chest 2 View  11/16/2012  *RADIOLOGY REPORT*  Clinical Data: Increased pain with deep inspiration, recent pneumothorax, COPD, history smoking  CHEST - 2 VIEW  Comparison: 11/07/2012  Findings: Normal heart size, mediastinal contours, pulmonary vascularity. Severe emphysematous changes with bilateral upper lobe scarring and volume loss consistent with COPD. Staple line at right apex from prior resection. Vague density in lower left chest on PA view could represent subtle infiltrate or summation artifact though nodule not completely excluded. Chronic accentuation of interstitial markings at right costophrenic angle is stable. Remaining lungs grossly clear. No pleural effusion or definite pneumothorax identified. Lucency at the left base on the previous exam is no longer identified. Bones unremarkable.  IMPRESSION: Severe emphysematous changes and bilateral upper lobe scarring consistent with COPD. No definite pneumothorax identified. Vague opacity in the lower left chest could represent a small focus of infiltrate, though nodule is not entirely excluded; recommend assessment on followup radiographs to ensure resolution.   Original Report Authenticated By: Ulyses Southward, M.D.      No diagnosis found.   Date: 11/16/2012  Rate: 76  Rhythm: normal sinus rhythm  QRS Axis: normal  Intervals: normal  ST/T Wave abnormalities: ST elevation in inferior and lateral leads more pronounced  Conduction Disutrbances:none  Narrative Interpretation:   Old EKG Reviewed: changes noted, ST elevation in inferior and lateral leads more pronounced than previously  1:23 PM Discussed the case with Dr. Elease Hashimoto with Cardiology.  He reviewed the EKG and feels that the EKG appears to be LVH with repolarization.    1:46 PM Reassessed patient.  He reports that his pain has improved at this time.  MDM   Patient with a history of COPD and Pneumothorax x 3 s/p VAT presents today with a chief complaint of left upper back pain and left lateral chest pain that has been present constantly for the past 1.5 weeks.   CXR today did not show any definite Pneumothorax today.  CXR did show a vague opacity in the left lower chest.  Patient has not had any fevers.  Pulse ox 100 on RA.  No leukocytosis.  Therefore, doubt Pneumonia.  Patient informed of the results of the CXR and need for follow up.  He reports that he has an appointment scheduled with his PCP in three days.  EKG reviewed by Cardiology who felt that the EKG was consistent with LVH with early repolarization.  Troponin negative.  D-dimer negative.  Pain improved during the course in the ED.  Patient not in any respiratory distress and appears to be stable for discharge.  Strict return precautions given.        Herbert Seta  Anne Shutter, PA-C 11/16/12 [phone]

## 2012-11-21 NOTE — ED Provider Notes (Signed)
History/physical exam/procedure(s) were performed by non-physician practitioner and as supervising physician I was immediately available for consultation/collaboration. I have reviewed all notes and am in agreement with care and plan.   Hilario Quarry, MD 11/21/12 [phone]

## 2013-01-09 ENCOUNTER — Encounter (HOSPITAL_BASED_OUTPATIENT_CLINIC_OR_DEPARTMENT_OTHER): Payer: Self-pay | Admitting: *Deleted

## 2013-01-09 ENCOUNTER — Emergency Department (HOSPITAL_BASED_OUTPATIENT_CLINIC_OR_DEPARTMENT_OTHER): Payer: Self-pay

## 2013-01-09 ENCOUNTER — Emergency Department (HOSPITAL_BASED_OUTPATIENT_CLINIC_OR_DEPARTMENT_OTHER)
Admission: EM | Admit: 2013-01-09 | Discharge: 2013-01-09 | Disposition: A | Discharge status: Home / Self Care | Payer: Self-pay | Attending: Emergency Medicine | Admitting: Emergency Medicine

## 2013-01-09 DIAGNOSIS — F17200 Nicotine dependence, unspecified, uncomplicated: Secondary | ICD-10-CM | POA: Insufficient documentation

## 2013-01-09 DIAGNOSIS — Z87442 Personal history of urinary calculi: Secondary | ICD-10-CM | POA: Insufficient documentation

## 2013-01-09 DIAGNOSIS — J4489 Other specified chronic obstructive pulmonary disease: Secondary | ICD-10-CM | POA: Insufficient documentation

## 2013-01-09 DIAGNOSIS — R0789 Other chest pain: Secondary | ICD-10-CM

## 2013-01-09 DIAGNOSIS — J449 Chronic obstructive pulmonary disease, unspecified: Secondary | ICD-10-CM | POA: Insufficient documentation

## 2013-01-09 DIAGNOSIS — Z8709 Personal history of other diseases of the respiratory system: Secondary | ICD-10-CM | POA: Insufficient documentation

## 2013-01-09 DIAGNOSIS — R071 Chest pain on breathing: Secondary | ICD-10-CM | POA: Insufficient documentation

## 2013-01-09 MED ORDER — OXYCODONE-ACETAMINOPHEN 5-325 MG PO TABS
2.0000 | ORAL_TABLET | Freq: Once | ORAL | Status: AC
  Administered 2013-01-09: 2 via ORAL
  Filled 2013-01-09 (×2): qty 2

## 2013-01-09 MED ORDER — OXYCODONE-ACETAMINOPHEN 5-325 MG PO TABS: 2.0000 | ORAL_TABLET | ORAL | Status: DC | PRN

## 2013-01-09 NOTE — ED Provider Notes (Signed)
History    This chart was scribed for Hilario Quarry, MD by Quintella Reichert, ED scribe.  This patient was seen in room MH09/MH09 and the patient's care was started at 3:38 PM.  CSN: [id number]  Arrival date & time 01/09/13  1519    Chief Complaint  Patient presents with  . Chest Pain    Patient is a 42 y.o. male presenting with chest pain. The history is provided by the patient. No language interpreter was used.  Chest Pain Chest pain location: Left lower posterior chest area, radiating into the front. Pain quality comment:  Sore, pulling Pain severity:  Moderate Onset quality:  Sudden Duration:  20 hours Timing:  Constant Progression:  Worsening Chronicity:  New Context comment:  Yard work several hours before onset Relieved by:  Nothing Worsened by:  Deep breathing Ineffective treatments:  None tried Associated symptoms: no abdominal pain, no altered mental status, no cough, no diaphoresis, no dizziness, no fever, no headache, no lower extremity edema, no nausea, no numbness, no shortness of breath, no syncope, not vomiting and no weakness     HPI Comments: Richard [name] is a 42 y.o. male with h/o spontaneous pneumothorax and COPD who presents to the Emergency Department complaining of constant, moderate CP that feels similar to prior pneumothorax episodes.  Pt states that yesterday he was doing yard work and later that night he developed pain to the left lower posterior chest area.  On waking this morning pain was radiating up to the front left chest.  Pain is described as soreness with a "pulling" sensation when he takes a deep breath.  He reports mild difficulty breathing secondary to pain but denies SOB.  He denies injuries to the chest and notes that prior pneumothorax was not associated with trauma.  He denies abdominal pain, pain or swelling in legs, or any other associated symptoms.  He has had spontaneous pneumothorax 2 times, and 2 years ago had a chest tube placed that  was not successful.  He subsequently received chest surgery from Dr. Donata Clay.  He is a current one-pack-a-day smoker and has smoked today.  He denies other chronic medical conditions or regular medication usage.  He denies medication allergies.  Pt ate steak 5 hours ago and has not eaten since then.   Past Medical History  Diagnosis Date  . Spontaneous pneumothorax   . Kidney calculi   . COPD (chronic obstructive pulmonary disease)    Past Surgical History  Procedure Laterality Date  . Cystoscopy    . Lithotripsy    . Lung surgery     History reviewed. No pertinent family history. History  Substance Use Topics  . Smoking status: Current Every Day Smoker -- 1.00 packs/day    Types: Cigarettes  . Smokeless tobacco: Never Used  . Alcohol Use: No    Review of Systems  Constitutional: Negative for fever and diaphoresis.  Respiratory: Negative for cough and shortness of breath.   Cardiovascular: Positive for chest pain. Negative for leg swelling and syncope.  Gastrointestinal: Negative for nausea, vomiting and abdominal pain.  Neurological: Negative for dizziness, weakness, numbness and headaches.  Psychiatric/Behavioral: Negative for altered mental status.  All other systems reviewed and are negative.    Allergies  Review of patient's allergies indicates no known allergies.  Home Medications   Current Outpatient Rx  Name  Route  Sig  Dispense  Refill  . ondansetron (ZOFRAN) 4 MG tablet   Oral   Take 1 tablet (4  mg total) by mouth every 8 (eight) hours as needed for nausea.   20 tablet   0   . oxyCODONE-acetaminophen (PERCOCET) 7.5-325 MG per tablet   Oral   Take 1 tablet by mouth every 4 (four) hours as needed for pain.   20 tablet   0   . oxyCODONE-acetaminophen (PERCOCET/ROXICET) 5-325 MG per tablet   Oral   Take 1-2 tablets by mouth every 6 (six) hours as needed for pain.   20 tablet   0   . oxyCODONE-acetaminophen (ROXICET) 5-325 MG per tablet   Oral    Take 1-2 tablets by mouth every 4 (four) hours as needed for pain.   30 tablet   0    BP 166/102  Pulse 86  Temp(Src) 97.7 F (36.5 C) (Oral)  Resp 20  Ht 5\' 7"  (1.702 m)  Wt 130 lb (58.968 kg)  BMI 20.36 kg/m2  SpO2 100%  Physical Exam  Nursing note and vitals reviewed. Constitutional: He is oriented to person, place, and time. He appears well-developed and well-nourished.  HENT:  Head: Normocephalic and atraumatic.  Right Ear: External ear normal.  Left Ear: External ear normal.  Nose: Nose normal.  Mouth/Throat: Oropharynx is clear and moist.  Eyes: Conjunctivae and EOM are normal. Pupils are equal, round, and reactive to light.  Neck: Normal range of motion. Neck supple.  Cardiovascular: Normal rate, regular rhythm, normal heart sounds and intact distal pulses.   HR 90, regular  Pulmonary/Chest: Effort normal. He has no wheezes. He has no rales.  Lung sounds decreased on left side.  Some splinting when pt breathes.  Abdominal: Soft. Bowel sounds are normal.  Musculoskeletal: Normal range of motion.  Neurological: He is alert and oriented to person, place, and time. He has normal reflexes.  Skin: Skin is warm and dry.  Psychiatric: He has a normal mood and affect. His behavior is normal. Thought content normal.    ED Course  Procedures (including critical care time)  DIAGNOSTIC STUDIES: Oxygen Saturation is 100% on room air, normal by my interpretation.    COORDINATION OF CARE: 3:43 PM-Discussed treatment plan which includes CXR and pain medication with pt at bedside and pt agreed to plan.    Labs Reviewed - No data to display  Dg Chest 2 View  01/09/2013   *RADIOLOGY REPORT*  Clinical Data: Left-sided chest pain.  History of pneumothorax.  CHEST - 2 VIEW  Comparison: 11/16/2012, 11/09/2012 and multiple previous  Findings: Severe pleural and parenchymal scarring in the upper lungs with volume loss.  No sign of active infiltrate, effusion, collapse or pneumothorax.   No significant bony finding.  IMPRESSION: No acute finding.  Chronic lung disease with scarring and volume loss in the upper lungs.  No sign of pneumothorax.   Original Report Authenticated By: Paulina Fusi, M.D.   Ct Chest Wo Contrast  01/09/2013   *RADIOLOGY REPORT*  Clinical Data: Sudden onset of shortness of breath.  Chronic lung disease.  History of previous pneumothorax.  CT CHEST WITHOUT CONTRAST  Technique:  Multidetector CT imaging of the chest was performed following the standard protocol without IV contrast.  Comparison: Chest radiography same day and multiple previous. Previous CT abdomen 10/14/2012.  Previous CT chest 03/11/2011.  Findings: The patient is again seen to have advanced emphysema with pulmonary scarring.  The disease is upper lobe predominant but does involve entire lungs.  Volume loss is present in the upper lungs. There is evidence of previous surgery in the left  upper lobe. There is no pneumothorax or hemothorax.  No evidence of infiltrate to or developing mass.  No acute bony finding.  No acute vascular pathology.  Scans in the upper abdomen show a stone in the upper pole left kidney but no acute finding.  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 Emphysematous changes are progressive over time.  No evidence of pneumothorax or other acute pathology.  See above for full discussion.   Original Report Authenticated By: Paulina Fusi, M.D.    No diagnosis found.  MDM  No evidence of pneumothorax on plain cxr or ct.  Patient given pain meds and advised to return if worse and to f/u pmd next week if not better.  Pain appears musculoskeletal.  Hemodynamically stable.     I personally performed the services described in this documentation, which was scribed in my presence. The recorded information has been reviewed and considered.   Hilario Quarry, MD 01/09/13 [phone]

## 2013-01-09 NOTE — ED Notes (Signed)
Patient transported to X-ray 

## 2013-01-09 NOTE — ED Notes (Signed)
Pt states he has a hx of a collapsed lung on the left and this feels the same. Was mowing and weed-eating yesterday and today, hurts to move, breathe, sneeze. BBS-slightly diminished on left. Taken to ED9. [place], RRT in to assess. Placed on continuous pulse ox. No distress.

## 2013-03-20 ENCOUNTER — Encounter (HOSPITAL_BASED_OUTPATIENT_CLINIC_OR_DEPARTMENT_OTHER): Payer: Self-pay | Admitting: *Deleted

## 2013-03-20 ENCOUNTER — Emergency Department (HOSPITAL_BASED_OUTPATIENT_CLINIC_OR_DEPARTMENT_OTHER): Payer: Self-pay

## 2013-03-20 ENCOUNTER — Emergency Department (HOSPITAL_BASED_OUTPATIENT_CLINIC_OR_DEPARTMENT_OTHER)
Admission: EM | Admit: 2013-03-20 | Discharge: 2013-03-20 | Disposition: A | Discharge status: Home / Self Care | Payer: Self-pay | Attending: Emergency Medicine | Admitting: Emergency Medicine

## 2013-03-20 DIAGNOSIS — Z87442 Personal history of urinary calculi: Secondary | ICD-10-CM | POA: Insufficient documentation

## 2013-03-20 DIAGNOSIS — R071 Chest pain on breathing: Secondary | ICD-10-CM | POA: Insufficient documentation

## 2013-03-20 DIAGNOSIS — Z79899 Other long term (current) drug therapy: Secondary | ICD-10-CM | POA: Insufficient documentation

## 2013-03-20 DIAGNOSIS — I10 Essential (primary) hypertension: Secondary | ICD-10-CM | POA: Insufficient documentation

## 2013-03-20 DIAGNOSIS — J441 Chronic obstructive pulmonary disease with (acute) exacerbation: Secondary | ICD-10-CM | POA: Insufficient documentation

## 2013-03-20 DIAGNOSIS — R110 Nausea: Secondary | ICD-10-CM | POA: Insufficient documentation

## 2013-03-20 DIAGNOSIS — F17200 Nicotine dependence, unspecified, uncomplicated: Secondary | ICD-10-CM | POA: Insufficient documentation

## 2013-03-20 DIAGNOSIS — R0789 Other chest pain: Secondary | ICD-10-CM

## 2013-03-20 LAB — CBC
HCT: 49.1 % (ref 39.0–52.0)
Hemoglobin: 16.7 g/dL (ref 13.0–17.0)
MCH: 29.8 pg (ref 26.0–34.0)
MCHC: 34 g/dL (ref 30.0–36.0)
MCV: 87.7 fL (ref 78.0–100.0)

## 2013-03-20 LAB — BASIC METABOLIC PANEL
BUN: 10 mg/dL (ref 6–23)
Calcium: 10.2 mg/dL (ref 8.4–10.5)
Creatinine, Ser: 0.9 mg/dL (ref 0.50–1.35)
GFR calc non Af Amer: >90 mL/min (ref >90)
Glucose, Bld: 108 mg/dL — ABNORMAL HIGH (ref 70–99)

## 2013-03-20 MED ORDER — HYDROMORPHONE HCL PF 1 MG/ML IJ SOLN
1.0000 mg | Freq: Once | INTRAMUSCULAR | Status: AC
  Administered 2013-03-20: 1 mg via INTRAVENOUS
  Filled 2013-03-20: qty 1

## 2013-03-20 MED ORDER — OXYCODONE-ACETAMINOPHEN 5-325 MG PO TABS: 1.0000 | ORAL_TABLET | Freq: Four times a day (QID) | ORAL | Status: DC | PRN

## 2013-03-20 MED ORDER — ONDANSETRON HCL 4 MG/2ML IJ SOLN
4.0000 mg | INTRAMUSCULAR | Status: AC
  Administered 2013-03-20: 4 mg via INTRAVENOUS
  Filled 2013-03-20: qty 2

## 2013-03-20 MED ORDER — LISINOPRIL 10 MG PO TABS: 10.0000 mg | ORAL_TABLET | Freq: Every day | ORAL | Status: AC

## 2013-03-20 MED ORDER — OXYCODONE-ACETAMINOPHEN 5-325 MG PO TABS
2.0000 | ORAL_TABLET | Freq: Once | ORAL | Status: AC
  Administered 2013-03-20: 2 via ORAL
  Filled 2013-03-20 (×2): qty 2

## 2013-03-20 NOTE — ED Notes (Signed)
Pt reports left chest & rib pain that started about 7:30 this morning with shortness of breath

## 2013-03-20 NOTE — ED Provider Notes (Signed)
CSN: [id number]     Arrival date & time 03/20/13  1513 History   First MD Initiated Contact with Patient 03/20/13 1525     Chief Complaint  Patient presents with  . Chest Pain   (Consider location/radiation/quality/duration/timing/severity/associated sxs/prior Treatment) HPI Comments: Patient is a 42 year old male with a history of COPD and spontaneous pneumothorax x3 s/p VAT procedure who presents for sudden onset of sharp left chest pain with shortness of breath with onset 7 hours ago. Patient states the pain has been constant since onset and mildly improved with Goody Powders. Patient states that pain is aggravated with palpation to his left lower chest wall as well as with deep inspiration. Patient states the pain feels similar to the spontaneous pneumothorax's he's had in the past. Patient endorses associated nausea and sporadic diaphoresis and denies any recent trauma or injury to his chest wall as well as fevers, central substernal chest pain, cough or hemoptysis, lightheadedness/dizziness, syncope, numbness/tingling, extremity weakness, jaw pain, vomiting, and abdominal pain. He is a 1.5ppd smoker. Denies a hx of HTN.  Patient is a 42 y.o. male presenting with chest pain.  Chest Pain Associated symptoms: nausea and shortness of breath   Associated symptoms: no abdominal pain, no dizziness, no fever, no numbness, not vomiting and no weakness     Past Medical History  Diagnosis Date  . Spontaneous pneumothorax   . Kidney calculi   . COPD (chronic obstructive pulmonary disease)    Past Surgical History  Procedure Laterality Date  . Cystoscopy    . Lithotripsy    . Lung surgery     No family history on file. History  Substance Use Topics  . Smoking status: Current Every Day Smoker -- 1.00 packs/day    Types: Cigarettes  . Smokeless tobacco: Never Used  . Alcohol Use: No    Review of Systems  Constitutional: Negative for fever.  Respiratory: Positive for chest tightness and  shortness of breath.   Cardiovascular: Positive for chest pain.  Gastrointestinal: Positive for nausea. Negative for vomiting and abdominal pain.  Neurological: Negative for dizziness, syncope, weakness, light-headedness and numbness.  All other systems reviewed and are negative.    Allergies  Review of patient's allergies indicates no known allergies.  Home Medications   Current Outpatient Rx  Name  Route  Sig  Dispense  Refill  . lisinopril (PRINIVIL,ZESTRIL) 10 MG tablet   Oral   Take 1 tablet (10 mg total) by mouth daily.   30 tablet   0   . ondansetron (ZOFRAN) 4 MG tablet   Oral   Take 1 tablet (4 mg total) by mouth every 8 (eight) hours as needed for nausea.   20 tablet   0   . oxyCODONE-acetaminophen (PERCOCET) 7.5-325 MG per tablet   Oral   Take 1 tablet by mouth every 4 (four) hours as needed for pain.   20 tablet   0   . oxyCODONE-acetaminophen (PERCOCET/ROXICET) 5-325 MG per tablet   Oral   Take 1-2 tablets by mouth every 6 (six) hours as needed for pain.   20 tablet   0   . oxyCODONE-acetaminophen (PERCOCET/ROXICET) 5-325 MG per tablet   Oral   Take 1-2 tablets by mouth every 6 (six) hours as needed for pain.   11 tablet   0   . oxyCODONE-acetaminophen (ROXICET) 5-325 MG per tablet   Oral   Take 1-2 tablets by mouth every 4 (four) hours as needed for pain.   30 tablet  0    BP 183/112  Pulse 95  Temp(Src) 97.6 F (36.4 C) (Oral)  Resp 16  Ht 5\' 7"  (1.702 m)  Wt 130 lb (58.968 kg)  BMI 20.36 kg/m2  SpO2 100%  Physical Exam  Nursing note and vitals reviewed. Constitutional: He is oriented to person, place, and time. He appears well-developed and well-nourished. No distress.  Patient visibly uncomfortable, but in no acute respiratory distress. Patient not diaphoretic.  HENT:  Head: Normocephalic and atraumatic.  Eyes: Conjunctivae and EOM are normal. No scleral icterus.  Neck: Normal range of motion.  Cardiovascular: Regular rhythm,  normal heart sounds and intact distal pulses.   Tachycardic to 101  Pulmonary/Chest: Effort normal and breath sounds normal. No respiratory distress. He has no wheezes. He has no rales. He exhibits tenderness.  TTP of anterior L lower chest wall. No accessory muscle use or retractions. Good inspiratory effort. No diminished breath sounds appreciated.  Abdominal: Soft. He exhibits no distension. There is no tenderness. There is no rebound and no guarding.  Musculoskeletal: Normal range of motion.  Neurological: He is alert and oriented to person, place, and time.  Skin: Skin is warm and dry. No rash noted. He is not diaphoretic. No erythema. No pallor.  Psychiatric: He has a normal mood and affect. His behavior is normal.   ED Course  Procedures (including critical care time) Labs Review Labs Reviewed  BASIC METABOLIC PANEL - Abnormal; Notable for the following:    Glucose, Bld 108 (*)    All other components within normal limits  CBC  TROPONIN I   Imaging Review Dg Chest 2 View  03/20/2013   *RADIOLOGY REPORT*  Clinical Data: Chest pain and rib pain with shortness of breath.  CHEST - 2 VIEW  Comparison: 01/09/2013  Findings: Lungs are adequately inflated with chronic coarse increased interstitial markings of the upper lobes. There is mild focal nodular airspace density over the right upper lobe not seen on the prior exams.  Cannot exclude an acute infectious process. There is mild emphysematous disease.  Cardiomediastinal silhouette and remainder of the exam is unchanged.  IMPRESSION: Subtle focal nodular airspace density of the right upper lobe which may be due to atelectasis or early infection.  Chronic stable coarse interstitial changes of the mid to upper lungs and emphysematous disease.   Original Report Authenticated By: Elberta Fortis, M.D.    Date: 03/20/2013  Rate: 96  Rhythm: normal sinus rhythm  QRS Axis: normal  Intervals: normal  ST/T Wave abnormalities: normal  Conduction  Disutrbances:none  Narrative Interpretation: NSR with LVH; no STEMI  Old EKG Reviewed: unchanged from 11/16/2012 I have personally reviewed and interpreted this EKG   MDM   1. Anterior chest wall pain   2. Hypertension    42 year old male with a history of spontaneous pneumothorax x3 presents for anterior chest wall pain. Patient well and nontoxic appearing on arrival, hemodynamically stable, and afebrile. Patient without tachycardia, dyspnea, tachypnea, or hypoxia. No retractions or accessory muscle use appreciated on physical exam. There is tenderness to palpation to the anterior inferior left chest wall. Patient's labs unremarkable and at baseline. Chest x-ray shows no evidence of pneumothorax. Ultrasound performed at bedside by Dr. Rhunette Croft to further evaluate for possible PTX not visualized on CXR with no acute findings. Spleen also visualized on U/S with no evidence of acute changes or splenomegaly. Patient hypertensive throughout ED stay today but asymptomatic for her headaches, vision changes, jaw pain, chest pain, or numbness or tingling in  his extremities. Patient denies history of hypertension, though he has been hypertensive during previous ED visits. Will start the patient on most appropriate blood pressure and advise blood pressure recheck in 24-48 hours with the [facility]. Percocet prescribed for chest wall pain control. Indications for ED return provided and patient agreeable to plan with no unaddressed concerns.    Antony Madura, PA-C 03/21/13 [phone]

## 2013-03-23 NOTE — ED Provider Notes (Signed)
Shared service with midlevel provider. I have personally seen and examined the patient, providing direct face to face care, presenting with the chief complaint of shortness of breath and chest pain = pleuritic. Hx of PTX. Physical exam findings include normal lung exam. Plan will be to d/c with pcp f/u. I reviewed the nursing documentation on past medical history, family history, and social history.     EMERGENCY DEPARTMENT [place] LUNG EXAM "Study: Limited Ultrasound of the Lung and Pleura"  INDICATIONS: Pleuritic chest pain and shortness of breath with hx of Pneumothorax Multiple views of the pleura were obtained in real-time with a multi-frequency probe.  PERFORMED ZO:XWRUEA  IMAGES ARCHIVED?: Yes  FINDINGS: + Lung sliding  LIMITATIONS:  None  VIEWS USED: Parasternal view, left sided  INTERPRETATION: No pneumothorax, confirmed with M mode.    Derwood Kaplan, MD 03/23/13 2005

## 2013-06-05 ENCOUNTER — Encounter (HOSPITAL_BASED_OUTPATIENT_CLINIC_OR_DEPARTMENT_OTHER): Payer: Self-pay | Admitting: Emergency Medicine

## 2013-06-05 ENCOUNTER — Emergency Department (HOSPITAL_BASED_OUTPATIENT_CLINIC_OR_DEPARTMENT_OTHER)
Admission: EM | Admit: 2013-06-05 | Discharge: 2013-06-05 | Disposition: A | Discharge status: Home / Self Care | Payer: Self-pay | Attending: Emergency Medicine | Admitting: Emergency Medicine

## 2013-06-05 DIAGNOSIS — Z87442 Personal history of urinary calculi: Secondary | ICD-10-CM | POA: Insufficient documentation

## 2013-06-05 DIAGNOSIS — Z79899 Other long term (current) drug therapy: Secondary | ICD-10-CM | POA: Insufficient documentation

## 2013-06-05 DIAGNOSIS — F17200 Nicotine dependence, unspecified, uncomplicated: Secondary | ICD-10-CM | POA: Insufficient documentation

## 2013-06-05 DIAGNOSIS — J449 Chronic obstructive pulmonary disease, unspecified: Secondary | ICD-10-CM | POA: Insufficient documentation

## 2013-06-05 DIAGNOSIS — J4489 Other specified chronic obstructive pulmonary disease: Secondary | ICD-10-CM | POA: Insufficient documentation

## 2013-06-05 DIAGNOSIS — K029 Dental caries, unspecified: Secondary | ICD-10-CM | POA: Insufficient documentation

## 2013-06-05 DIAGNOSIS — R51 Headache: Secondary | ICD-10-CM | POA: Insufficient documentation

## 2013-06-05 MED ORDER — PENICILLIN V POTASSIUM 500 MG PO TABS: 500.0000 mg | ORAL_TABLET | Freq: Three times a day (TID) | ORAL | Status: AC

## 2013-06-05 MED ORDER — OXYCODONE-ACETAMINOPHEN 5-325 MG PO TABS
1.0000 | ORAL_TABLET | Freq: Once | ORAL | Status: AC
  Administered 2013-06-05: 1 via ORAL
  Filled 2013-06-05: qty 1

## 2013-06-05 MED ORDER — OXYCODONE-ACETAMINOPHEN 5-325 MG PO TABS: 1.0000 | ORAL_TABLET | ORAL | Status: AC | PRN

## 2013-06-05 MED ORDER — PENICILLIN V POTASSIUM 250 MG PO TABS
500.0000 mg | ORAL_TABLET | Freq: Once | ORAL | Status: AC
  Administered 2013-06-05: 500 mg via ORAL
  Filled 2013-06-05: qty 2

## 2013-06-05 NOTE — ED Provider Notes (Signed)
CSN: [id number]     Arrival date & time 06/05/13  1255 History   First MD Initiated Contact with Patient 06/05/13 1320     Chief Complaint  Patient presents with  . Dental Pain   (Consider location/radiation/quality/duration/timing/severity/associated sxs/prior Treatment) HPI Comments: A 42 year old male presents to ED complaining of L upper molar tooth pain X 6 hours. He was eating bacon with breakfast and suddenly felt tooth pain. He had some mild bleeding and saw a piece of tooth. He describes the pain as throbbing and it radiates to L forehead. Applied goody power but no relief. He does smokes one pack a day. Denies hx of similar pain. He denies known swallowing of any piece of tooth, SOB, CP, fever or chills.   Patient is a 42 y.o. male presenting with tooth pain. The history is provided by the patient. No language interpreter was used.  Dental Pain Location:  Upper Associated symptoms: headaches   Associated symptoms: no facial swelling and no fever     Past Medical History  Diagnosis Date  . Spontaneous pneumothorax   . Kidney calculi   . COPD (chronic obstructive pulmonary disease)    Past Surgical History  Procedure Laterality Date  . Cystoscopy    . Lithotripsy    . Lung surgery     No family history on file. History  Substance Use Topics  . Smoking status: Current Every Day Smoker -- 1.00 packs/day    Types: Cigarettes  . Smokeless tobacco: Never Used  . Alcohol Use: No    Review of Systems  Constitutional: Negative for fever.  HENT: Negative for facial swelling and trouble swallowing.        L upper tooth pain.  Neurological: Positive for headaches.    Allergies  Review of patient's allergies indicates no known allergies.  Home Medications   Current Outpatient Rx  Name  Route  Sig  Dispense  Refill  . lisinopril (PRINIVIL,ZESTRIL) 10 MG tablet   Oral   Take 1 tablet (10 mg total) by mouth daily.   30 tablet   0    BP 202/111  Pulse 98   Temp(Src) 98.6 F (37 C) (Oral)  Resp 20  SpO2 99% Physical Exam  Constitutional: He is oriented to person, place, and time. He appears well-developed and well-nourished.  Uncomfortable appearing.  HENT:  Head: Normocephalic and atraumatic.  TTP at L upper 2nd molar tooth with marked decay and no evidence of acute fracture. No gum edema, erythema or bleeding. Generally poor dentition with significant decay affecting molar teeth predominantly.  Neck: Normal range of motion. Neck supple.  Neurological: He is alert and oriented to person, place, and time.  Skin: Skin is warm and dry.  Psychiatric: He has a normal mood and affect.    ED Course  Procedures (including critical care time) Labs Review Labs Reviewed - No data to display Imaging Review No results found.  EKG Interpretation   None       MDM  No diagnosis found. 1. Dental decay  Marked dental decay - will ocver with abx and pain management. Encouraged dental follow up.Arnoldo Hooker, PA-C 06/05/13 1640

## 2013-06-05 NOTE — ED Notes (Signed)
patient reports that left upper tooth broke last pm, pain to same. Tried BC powders without relief

## 2013-06-06 NOTE — ED Provider Notes (Signed)
Medical screening examination/treatment/procedure(s) were performed by non-physician practitioner and as supervising physician I was immediately available for consultation/collaboration.  EKG Interpretation   None         Rolan Bucco, MD 06/06/13 [phone]

## 2013-07-25 ENCOUNTER — Emergency Department (HOSPITAL_BASED_OUTPATIENT_CLINIC_OR_DEPARTMENT_OTHER)
Admission: EM | Admit: 2013-07-25 | Discharge: 2013-07-25 | Disposition: A | Discharge status: Home / Self Care | Payer: Self-pay | Attending: Emergency Medicine | Admitting: Emergency Medicine

## 2013-07-25 ENCOUNTER — Emergency Department (HOSPITAL_BASED_OUTPATIENT_CLINIC_OR_DEPARTMENT_OTHER): Payer: Self-pay

## 2013-07-25 ENCOUNTER — Encounter (HOSPITAL_BASED_OUTPATIENT_CLINIC_OR_DEPARTMENT_OTHER): Payer: Self-pay | Admitting: Emergency Medicine

## 2013-07-25 DIAGNOSIS — Z87442 Personal history of urinary calculi: Secondary | ICD-10-CM | POA: Insufficient documentation

## 2013-07-25 DIAGNOSIS — R071 Chest pain on breathing: Secondary | ICD-10-CM | POA: Insufficient documentation

## 2013-07-25 DIAGNOSIS — Z79899 Other long term (current) drug therapy: Secondary | ICD-10-CM | POA: Insufficient documentation

## 2013-07-25 DIAGNOSIS — F17200 Nicotine dependence, unspecified, uncomplicated: Secondary | ICD-10-CM | POA: Insufficient documentation

## 2013-07-25 DIAGNOSIS — Z9889 Other specified postprocedural states: Secondary | ICD-10-CM | POA: Insufficient documentation

## 2013-07-25 DIAGNOSIS — J449 Chronic obstructive pulmonary disease, unspecified: Secondary | ICD-10-CM | POA: Insufficient documentation

## 2013-07-25 DIAGNOSIS — J4489 Other specified chronic obstructive pulmonary disease: Secondary | ICD-10-CM | POA: Insufficient documentation

## 2013-07-25 DIAGNOSIS — R000 Tachycardia, unspecified: Secondary | ICD-10-CM | POA: Insufficient documentation

## 2013-07-25 DIAGNOSIS — R0789 Other chest pain: Secondary | ICD-10-CM

## 2013-07-25 MED ORDER — OXYCODONE-ACETAMINOPHEN 5-325 MG PO TABS: 2.0000 | ORAL_TABLET | ORAL | Status: DC | PRN

## 2013-07-25 MED ORDER — OXYCODONE-ACETAMINOPHEN 5-325 MG PO TABS
2.0000 | ORAL_TABLET | Freq: Once | ORAL | Status: AC
  Administered 2013-07-25: 2 via ORAL
  Filled 2013-07-25: qty 2

## 2013-07-25 NOTE — ED Notes (Signed)
Pt states tried to lift tire while changing flat on van and felt something pull across left side of chest. States has hx of left lung collapse

## 2013-07-25 NOTE — ED Provider Notes (Signed)
CSN: [id number]     Arrival date & time 07/25/13  1540 History   First MD Initiated Contact with Patient 07/25/13 1756     Chief Complaint  Patient presents with  . Shortness of Breath   (Consider location/radiation/quality/duration/timing/severity/associated sxs/prior Treatment) HPI Changing a flat tire the patient felt like he pulled a muscle in his left chest a sharp stabbing left lower lateral chest pain making her feel short of breath whenever he twists his chest wall he is not coughing he is no wheezing is no abdominal pain no vomiting or lightheadedness no exertional chest pain he was to make sure he does not have a pneumothorax because he has spontaneous pneumothorax in the past his pain is moderately severe nonradiating without associated symptoms no treatment prior to arrival. Past Medical History  Diagnosis Date  . Spontaneous pneumothorax   . Kidney calculi   . COPD (chronic obstructive pulmonary disease)    Past Surgical History  Procedure Laterality Date  . Cystoscopy    . Lithotripsy    . Lung surgery Left     pt states "cut out 2% of the bottom"  . Chest tube insertion     No family history on file. History  Substance Use Topics  . Smoking status: Current Every Day Smoker -- 1.00 packs/day    Types: Cigarettes  . Smokeless tobacco: Never Used  . Alcohol Use: No    Review of Systems 10 Systems reviewed and are negative for acute change except as noted in the HPI. Allergies  Review of patient's allergies indicates no known allergies.  Home Medications   Current Outpatient Rx  Name  Route  Sig  Dispense  Refill  . lisinopril (PRINIVIL,ZESTRIL) 10 MG tablet   Oral   Take 1 tablet (10 mg total) by mouth daily.   30 tablet   0   . oxyCODONE-acetaminophen (PERCOCET) 5-325 MG per tablet   Oral   Take 2 tablets by mouth every 4 (four) hours as needed.   20 tablet   0   . oxyCODONE-acetaminophen (PERCOCET/ROXICET) 5-325 MG per tablet   Oral   Take 1  tablet by mouth every 4 (four) hours as needed for severe pain.   12 tablet   0   . penicillin v potassium (VEETID) 500 MG tablet   Oral   Take 1 tablet (500 mg total) by mouth 3 (three) times daily.   30 tablet   0    BP 155/97  Pulse 90  Temp(Src) 99.4 F (37.4 C) (Oral)  Resp 18  Ht 5\' 7"  (1.702 m)  Wt 130 lb (58.968 kg)  BMI 20.36 kg/m2  SpO2 100% Physical Exam  Nursing note and vitals reviewed. Constitutional:  Awake, alert, nontoxic appearance.  HENT:  Head: Atraumatic.  Eyes: Right eye exhibits no discharge. Left eye exhibits no discharge.  Neck: Neck supple.  Cardiovascular: Regular rhythm.   No murmur heard. Mildly tachycardic  Pulmonary/Chest: Effort normal and breath sounds normal. No respiratory distress. He has no wheezes. He has no rales. He exhibits tenderness.  Exactly reproducible left anterolateral lower chest wall tenderness without subcutaneous emphysema without rash  Abdominal: Soft. There is no tenderness. There is no rebound.  Musculoskeletal: He exhibits no tenderness.  Baseline ROM, no obvious new focal weakness.  Neurological:  Mental status and motor strength appears baseline for patient and situation.  Skin: No rash noted.  Psychiatric: He has a normal mood and affect.    ED Course  Procedures (including  critical care time) Patient informed of clinical course, understand medical decision-making process, and agree with plan. Labs Review Labs Reviewed - No data to display Imaging Review Dg Chest 2 View  07/25/2013   CLINICAL DATA:  Chest pain.  EXAM: CHEST  2 VIEW  COMPARISON:  07/05/2013.  FINDINGS: The cardiac silhouette, mediastinal and hilar contours are within normal limits and stable. Severe upper lobe emphysematous changes and pulmonary scarring with retraction of the pulmonary hila. Surgical scarring changes noted at the left lung apex but No acute overlying pulmonary process or pleural effusion. No pneumothorax. The bony thorax is  intact.  IMPRESSION: Chronic emphysematous and upper lobe pulmonary scarring changes. No definite acute overlying pulmonary process.   Electronically Signed   By: Kalman Jewels M.D.   On: 07/25/2013 16:18    EKG Interpretation   None       MDM   1. Chest wall pain    I doubt any other EMC precluding discharge at this time including, but not necessarily limited to the following:PTX.    [name], MD 07/26/13 2150

## 2013-07-25 NOTE — Discharge Instructions (Signed)
You have been diagnosed by your caregiver as having chest wall pain. °SEEK IMMEDIATE MEDICAL ATTENTION IF: °You develop a fever.  °Your chest pains become severe or intolerable.  °You develop new, unexplained symptoms (problems).  °You develop shortness of breath, nausea, vomiting, sweating or feel light headed.  °You develop a new cough or you cough up blood. ° °

## 2013-09-02 ENCOUNTER — Emergency Department (HOSPITAL_BASED_OUTPATIENT_CLINIC_OR_DEPARTMENT_OTHER): Payer: Self-pay

## 2013-09-02 ENCOUNTER — Encounter (HOSPITAL_BASED_OUTPATIENT_CLINIC_OR_DEPARTMENT_OTHER): Payer: Self-pay | Admitting: Emergency Medicine

## 2013-09-02 ENCOUNTER — Emergency Department (HOSPITAL_BASED_OUTPATIENT_CLINIC_OR_DEPARTMENT_OTHER)
Admission: EM | Admit: 2013-09-02 | Discharge: 2013-09-02 | Disposition: A | Discharge status: Home / Self Care | Payer: Self-pay | Attending: Emergency Medicine | Admitting: Emergency Medicine

## 2013-09-02 DIAGNOSIS — F17200 Nicotine dependence, unspecified, uncomplicated: Secondary | ICD-10-CM | POA: Insufficient documentation

## 2013-09-02 DIAGNOSIS — R0789 Other chest pain: Secondary | ICD-10-CM

## 2013-09-02 DIAGNOSIS — J449 Chronic obstructive pulmonary disease, unspecified: Secondary | ICD-10-CM | POA: Insufficient documentation

## 2013-09-02 DIAGNOSIS — Z79899 Other long term (current) drug therapy: Secondary | ICD-10-CM | POA: Insufficient documentation

## 2013-09-02 DIAGNOSIS — R071 Chest pain on breathing: Secondary | ICD-10-CM | POA: Insufficient documentation

## 2013-09-02 DIAGNOSIS — Z87442 Personal history of urinary calculi: Secondary | ICD-10-CM | POA: Insufficient documentation

## 2013-09-02 DIAGNOSIS — J4489 Other specified chronic obstructive pulmonary disease: Secondary | ICD-10-CM | POA: Insufficient documentation

## 2013-09-02 DIAGNOSIS — Z8709 Personal history of other diseases of the respiratory system: Secondary | ICD-10-CM | POA: Insufficient documentation

## 2013-09-02 MED ORDER — HYDROCODONE-ACETAMINOPHEN 5-325 MG PO TABS
2.0000 | ORAL_TABLET | Freq: Once | ORAL | Status: AC
  Administered 2013-09-02: 2 via ORAL
  Filled 2013-09-02: qty 2

## 2013-09-02 MED ORDER — KETOROLAC TROMETHAMINE 60 MG/2ML IM SOLN: 60.0000 mg | Freq: Once | INTRAMUSCULAR | Status: DC

## 2013-09-02 MED ORDER — KETOROLAC TROMETHAMINE 30 MG/ML IJ SOLN
INTRAMUSCULAR | Status: AC
  Administered 2013-09-02: 30 mg
  Filled 2013-09-02: qty 2

## 2013-09-02 MED ORDER — HYDROCODONE-ACETAMINOPHEN 5-325 MG PO TABS: 2.0000 | ORAL_TABLET | ORAL | Status: AC | PRN

## 2013-09-02 NOTE — Discharge Instructions (Signed)
Ibuprofen 600 mg 3 times daily for the next 5 days.  Hydrocodone as prescribed as needed for pain not relieved with ibuprofen.  Return to the emergency department if you develop severe pain, difficulty breathing, or other new or concerning symptoms.   Chest Wall Pain Chest wall pain is pain in or around the bones and muscles of your chest. It may take up to 6 weeks to get better. It may take longer if you must stay physically active in your work and activities.  CAUSES  Chest wall pain may happen on its own. However, it may be caused by:  A viral illness like the flu.  Injury.  Coughing.  Exercise.  Arthritis.  Fibromyalgia.  Shingles. HOME CARE INSTRUCTIONS   Avoid overtiring physical activity. Try not to strain or perform activities that cause pain. This includes any activities using your chest or your abdominal and side muscles, especially if heavy weights are used.  Put ice on the sore area.  Put ice in a plastic bag.  Place a towel between your skin and the bag.  Leave the ice on for 15-20 minutes per hour while awake for the first 2 days.  Only take over-the-counter or prescription medicines for pain, discomfort, or fever as directed by your caregiver. SEEK IMMEDIATE MEDICAL CARE IF:   Your pain increases, or you are very uncomfortable.  You have a fever.  Your chest pain becomes worse.  You have new, unexplained symptoms.  You have nausea or vomiting.  You feel sweaty or lightheaded.  You have a cough with phlegm (sputum), or you cough up blood. MAKE SURE YOU:   Understand these instructions.  Will watch your condition.  Will get help right away if you are not doing well or get worse. Document Released: 07/01/2005 Document Revised: 09/23/2011 Document Reviewed: 02/25/2011 [facility] Patient Information 2014 [place], [place].

## 2013-09-02 NOTE — ED Provider Notes (Addendum)
CSN: [id number]     Arrival date & time 09/02/13  1622 History   First MD Initiated Contact with Patient 09/02/13 1703     Chief Complaint  Patient presents with  . Chest Pain     (Consider location/radiation/quality/duration/timing/severity/associated sxs/prior Treatment) HPI Comments: Patient is a 43 year old male with history of spontaneous pneumothorax and COPD. He presents with complaints of sudden onset left-sided chest discomfort. He was doing nothing strenuous and there was no trauma. He feels somewhat short of breath but denies cough, fever.  Patient is a 43 y.o. male presenting with chest pain. The history is provided by the patient.  Chest Pain Pain location:  L chest Pain quality: sharp   Pain radiates to:  Does not radiate Pain radiates to the back: no   Pain severity:  Moderate Onset quality:  Sudden Duration:  6 hours Timing:  Constant Progression:  Unchanged Chronicity:  New Context: not breathing, not lifting and no movement   Relieved by:  Nothing Worsened by:  Nothing tried   Past Medical History  Diagnosis Date  . Spontaneous pneumothorax   . Kidney calculi   . COPD (chronic obstructive pulmonary disease)    Past Surgical History  Procedure Laterality Date  . Cystoscopy    . Lithotripsy    . Lung surgery Left     pt states "cut out 2% of the bottom"  . Chest tube insertion     No family history on file. History  Substance Use Topics  . Smoking status: Current Every Day Smoker -- 1.00 packs/day    Types: Cigarettes  . Smokeless tobacco: Never Used  . Alcohol Use: No    Review of Systems  Cardiovascular: Positive for chest pain.  All other systems reviewed and are negative.      Allergies  Review of patient's allergies indicates no known allergies.  Home Medications   Current Outpatient Rx  Name  Route  Sig  Dispense  Refill  . lisinopril (PRINIVIL,ZESTRIL) 10 MG tablet   Oral   Take 1 tablet (10 mg total) by mouth daily.   30  tablet   0   . oxyCODONE-acetaminophen (PERCOCET) 5-325 MG per tablet   Oral   Take 2 tablets by mouth every 4 (four) hours as needed.   20 tablet   0   . oxyCODONE-acetaminophen (PERCOCET/ROXICET) 5-325 MG per tablet   Oral   Take 1 tablet by mouth every 4 (four) hours as needed for severe pain.   12 tablet   0   . penicillin v potassium (VEETID) 500 MG tablet   Oral   Take 1 tablet (500 mg total) by mouth 3 (three) times daily.   30 tablet   0    BP 172/95  Pulse 93  Temp(Src) 97.8 F (36.6 C) (Oral)  Resp 18  Ht 5\' 7"  (1.702 m)  Wt 130 lb (58.968 kg)  BMI 20.36 kg/m2  SpO2 100% Physical Exam  Nursing note and vitals reviewed. Constitutional: He is oriented to person, place, and time. He appears well-developed and well-nourished. No distress.  HENT:  Head: Normocephalic and atraumatic.  Mouth/Throat: Oropharynx is clear and moist.  Neck: Normal range of motion. Neck supple.  Cardiovascular: Normal rate, regular rhythm and normal heart sounds.   No murmur heard. Pulmonary/Chest: Breath sounds normal. No respiratory distress. He has no wheezes. He exhibits tenderness.  There is tenderness to palpation of the left lateral and anterior chest wall.  Abdominal: Soft. Bowel sounds are normal.  He exhibits no distension. There is no tenderness.  Musculoskeletal: Normal range of motion.  Lymphadenopathy:    He has no cervical adenopathy.  Neurological: He is alert and oriented to person, place, and time.  Skin: Skin is warm and dry. He is not diaphoretic.    ED Course  Procedures (including critical care time) Labs Review Labs Reviewed - No data to display Imaging Review Dg Chest 2 View  09/02/2013   CLINICAL DATA:  Chest pain.  EXAM: CHEST  2 VIEW  COMPARISON:  August 09, 2013.  FINDINGS: Cardiomediastinal silhouette appears normal. Stable scarring and pleural thickening is noted in both lung apices. No pneumothorax or pleural effusion is noted. No acute pulmonary  disease is noted. Bony thorax is intact.  IMPRESSION: No acute cardiopulmonary abnormality seen.   Electronically Signed   By: Sabino Dick M.D.   On: 09/02/2013 16:54    EKG Interpretation    Date/Time:  Thursday September 02 2013 16:32:36 EST Ventricular Rate:  88 PR Interval:  162 QRS Duration: 88 QT Interval:  334 QTC Calculation: 404 R Axis:   88 Text Interpretation:  Normal sinus rhythm Moderate voltage criteria for LVH, may be normal variant Borderline ECG Confirmed by DELOS  MD, Jenell Dobransky ([id number]) on 09/02/2013 4:44:07 PM            MDM   Final diagnoses:  None    Patient is a 43 year old male with history of COPD and pneumothorax. He presents with left-sided chest pain that started several hours ago. It is reproducible with palpation of the chest wall and EKG is unremarkable. Chest x-ray does not reveal a pneumothorax. This appears to be a musculoskeletal pain. He was given Toradol and Norco and appears more comfortable. He will be discharged to home with when necessary return. I doubt pneumothorax his symptoms are not typical for pulmonary embolism.    Veryl Speak, MD 09/02/13 [place], MD 09/02/13 [phone]

## 2013-09-02 NOTE — ED Notes (Signed)
C/o left side CP, SOB  started today approx 10-11am

## 2013-10-16 ENCOUNTER — Emergency Department (HOSPITAL_BASED_OUTPATIENT_CLINIC_OR_DEPARTMENT_OTHER)
Admission: EM | Admit: 2013-10-16 | Discharge: 2013-10-16 | Disposition: A | Discharge status: Home / Self Care | Payer: Self-pay | Attending: Emergency Medicine | Admitting: Emergency Medicine

## 2013-10-16 ENCOUNTER — Encounter (HOSPITAL_BASED_OUTPATIENT_CLINIC_OR_DEPARTMENT_OTHER): Payer: Self-pay | Admitting: Emergency Medicine

## 2013-10-16 DIAGNOSIS — R6889 Other general symptoms and signs: Secondary | ICD-10-CM | POA: Diagnosis present

## 2013-10-16 DIAGNOSIS — R110 Nausea: Secondary | ICD-10-CM | POA: Insufficient documentation

## 2013-10-16 DIAGNOSIS — R05 Cough: Secondary | ICD-10-CM | POA: Insufficient documentation

## 2013-10-16 DIAGNOSIS — J449 Chronic obstructive pulmonary disease, unspecified: Secondary | ICD-10-CM | POA: Insufficient documentation

## 2013-10-16 DIAGNOSIS — R197 Diarrhea, unspecified: Secondary | ICD-10-CM | POA: Insufficient documentation

## 2013-10-16 DIAGNOSIS — J3489 Other specified disorders of nose and nasal sinuses: Secondary | ICD-10-CM | POA: Insufficient documentation

## 2013-10-16 DIAGNOSIS — J4489 Other specified chronic obstructive pulmonary disease: Secondary | ICD-10-CM | POA: Insufficient documentation

## 2013-10-16 DIAGNOSIS — Z87442 Personal history of urinary calculi: Secondary | ICD-10-CM | POA: Insufficient documentation

## 2013-10-16 DIAGNOSIS — IMO0001 Reserved for inherently not codable concepts without codable children: Secondary | ICD-10-CM | POA: Insufficient documentation

## 2013-10-16 DIAGNOSIS — R059 Cough, unspecified: Secondary | ICD-10-CM | POA: Insufficient documentation

## 2013-10-16 DIAGNOSIS — R1084 Generalized abdominal pain: Secondary | ICD-10-CM | POA: Insufficient documentation

## 2013-10-16 DIAGNOSIS — Z79899 Other long term (current) drug therapy: Secondary | ICD-10-CM | POA: Insufficient documentation

## 2013-10-16 DIAGNOSIS — F17200 Nicotine dependence, unspecified, uncomplicated: Secondary | ICD-10-CM | POA: Insufficient documentation

## 2013-10-16 LAB — CBC WITH DIFFERENTIAL/PLATELET
Basophils Absolute: 0 10*3/uL (ref 0.0–0.1)
Basophils Relative: 0 % (ref 0–1)
Eosinophils Absolute: 0.3 10*3/uL (ref 0.0–0.7)
Eosinophils Relative: 4 % (ref 0–5)
HCT: 45.1 % (ref 39.0–52.0)
HEMOGLOBIN: 15.5 g/dL (ref 13.0–17.0)
LYMPHS ABS: 2.1 10*3/uL (ref 0.7–4.0)
LYMPHS PCT: 25 % (ref 12–46)
MCH: 31.3 pg (ref 26.0–34.0)
MCHC: 34.4 g/dL (ref 30.0–36.0)
MCV: 90.9 fL (ref 78.0–100.0)
Monocytes Absolute: 0.7 10*3/uL (ref 0.1–1.0)
Monocytes Relative: 8 % (ref 3–12)
NEUTROS ABS: 5.2 10*3/uL (ref 1.7–7.7)
Neutrophils Relative %: 62 % (ref 43–77)
PLATELETS: 237 10*3/uL (ref 150–400)
RBC: 4.96 MIL/uL (ref 4.22–5.81)
RDW: 12.7 % (ref 11.5–15.5)
WBC: 8.4 10*3/uL (ref 4.0–10.5)

## 2013-10-16 LAB — COMPREHENSIVE METABOLIC PANEL
ALK PHOS: 69 U/L (ref 39–117)
ALT: 8 U/L (ref 0–53)
AST: 14 U/L (ref 0–37)
Albumin: 3.9 g/dL (ref 3.5–5.2)
BUN: 13 mg/dL (ref 6–23)
CHLORIDE: 104 meq/L (ref 96–112)
CO2: 24 mEq/L (ref 19–32)
Calcium: 9.2 mg/dL (ref 8.4–10.5)
Creatinine, Ser: 0.9 mg/dL (ref 0.50–1.35)
GFR calc non Af Amer: >90 mL/min (ref >90)
GLUCOSE: 94 mg/dL (ref 70–99)
POTASSIUM: 4 meq/L (ref 3.7–5.3)
SODIUM: 142 meq/L (ref 137–147)
TOTAL PROTEIN: 7.3 g/dL (ref 6.0–8.3)

## 2013-10-16 LAB — LIPASE, BLOOD: Lipase: 19 U/L (ref 11–59)

## 2013-10-16 LAB — OCCULT BLOOD X 1 CARD TO LAB, STOOL: Fecal Occult Bld: NEGATIVE

## 2013-10-16 MED ORDER — MORPHINE SULFATE 4 MG/ML IJ SOLN
4.0000 mg | INTRAMUSCULAR | Status: AC
  Administered 2013-10-16: 4 mg via INTRAVENOUS
  Filled 2013-10-16: qty 1

## 2013-10-16 MED ORDER — SODIUM CHLORIDE 0.9 % IV BOLUS (SEPSIS)
1000.0000 mL | INTRAVENOUS | Status: AC
  Administered 2013-10-16: 1000 mL via INTRAVENOUS

## 2013-10-16 MED ORDER — OSELTAMIVIR PHOSPHATE 75 MG PO CAPS
75.0000 mg | ORAL_CAPSULE | Freq: Once | ORAL | Status: AC
  Administered 2013-10-16: 75 mg via ORAL
  Filled 2013-10-16: qty 1

## 2013-10-16 MED ORDER — OSELTAMIVIR PHOSPHATE 75 MG PO CAPS: 75.0000 mg | ORAL_CAPSULE | Freq: Two times a day (BID) | ORAL | Status: AC

## 2013-10-16 MED ORDER — ONDANSETRON 4 MG PO TBDP: ORAL_TABLET | ORAL | Status: AC

## 2013-10-16 MED ORDER — OXYCODONE-ACETAMINOPHEN 5-325 MG PO TABS: 1.0000 | ORAL_TABLET | Freq: Four times a day (QID) | ORAL | Status: DC | PRN

## 2013-10-16 MED ORDER — ONDANSETRON HCL 4 MG/2ML IJ SOLN
4.0000 mg | Freq: Once | INTRAMUSCULAR | Status: AC
  Administered 2013-10-16: 4 mg via INTRAVENOUS
  Filled 2013-10-16: qty 2

## 2013-10-16 MED ORDER — HEPARIN (PORCINE) IN NACL 100-0.45 UNIT/ML-% IJ SOLN
INTRAMUSCULAR | Status: AC
  Filled 2013-10-16: qty 250

## 2013-10-16 NOTE — ED Notes (Signed)
MD at bedside. 

## 2013-10-16 NOTE — ED Provider Notes (Signed)
CSN: [id number]     Arrival date & time 10/16/13  1423 History   First MD Initiated Contact with Patient 10/16/13 1453     Chief Complaint  Patient presents with  . flu like symptoms   . Abdominal Pain  . Nasal Congestion     (Consider location/radiation/quality/duration/timing/severity/associated sxs/prior Treatment) Patient is a 43 y.o. male presenting with abdominal pain. The history is provided by the patient.  Abdominal Pain Pain location:  Generalized Pain quality: cramping   Pain radiates to:  Does not radiate Pain severity:  Moderate Onset quality:  Gradual Duration:  1 day Timing:  Intermittent Progression:  Waxing and waning Chronicity:  New Context comment:  Likely viral syndrome Relieved by:  Nothing Worsened by:  Nothing tried Ineffective treatments: pepto bismol. Associated symptoms: cough (mild), diarrhea and nausea   Associated symptoms: no chest pain, no dysuria, no fever, no hematuria, no shortness of breath and no vomiting     Past Medical History  Diagnosis Date  . Spontaneous pneumothorax   . Kidney calculi   . COPD (chronic obstructive pulmonary disease)    Past Surgical History  Procedure Laterality Date  . Cystoscopy    . Lithotripsy    . Lung surgery Left     pt states "cut out 2% of the bottom"  . Chest tube insertion     No family history on file. History  Substance Use Topics  . Smoking status: Current Every Day Smoker -- 1.00 packs/day    Types: Cigarettes  . Smokeless tobacco: Never Used  . Alcohol Use: No    Review of Systems  Constitutional: Negative for fever.       Body aches  HENT: Negative for drooling and rhinorrhea.   Eyes: Negative for pain.  Respiratory: Positive for cough (mild). Negative for shortness of breath.   Cardiovascular: Negative for chest pain and leg swelling.  Gastrointestinal: Positive for nausea, abdominal pain and diarrhea. Negative for vomiting.  Genitourinary: Negative for dysuria and hematuria.   Musculoskeletal: Negative for gait problem and neck pain.  Skin: Negative for color change.  Neurological: Negative for numbness and headaches.  Hematological: Negative for adenopathy.  Psychiatric/Behavioral: Negative for behavioral problems.  All other systems reviewed and are negative.      Allergies  Review of patient's allergies indicates no known allergies.  Home Medications   Current Outpatient Rx  Name  Route  Sig  Dispense  Refill  . HYDROcodone-acetaminophen (NORCO) 5-325 MG per tablet   Oral   Take 2 tablets by mouth every 4 (four) hours as needed.   12 tablet   0   . lisinopril (PRINIVIL,ZESTRIL) 10 MG tablet   Oral   Take 1 tablet (10 mg total) by mouth daily.   30 tablet   0   . oxyCODONE-acetaminophen (PERCOCET) 5-325 MG per tablet   Oral   Take 2 tablets by mouth every 4 (four) hours as needed.   20 tablet   0   . oxyCODONE-acetaminophen (PERCOCET/ROXICET) 5-325 MG per tablet   Oral   Take 1 tablet by mouth every 4 (four) hours as needed for severe pain.   12 tablet   0   . penicillin v potassium (VEETID) 500 MG tablet   Oral   Take 1 tablet (500 mg total) by mouth 3 (three) times daily.   30 tablet   0    BP 173/113  Pulse 82  Temp(Src) 97.8 F (36.6 C)  Resp 14  Ht 5\' 7"  (  1.702 m)  Wt 130 lb (58.968 kg)  BMI 20.36 kg/m2  SpO2 100% Physical Exam  Nursing note and vitals reviewed. Constitutional: He is oriented to person, place, and time. He appears well-developed and well-nourished.  HENT:  Head: Normocephalic and atraumatic.  Right Ear: External ear normal.  Left Ear: External ear normal.  Nose: Nose normal.  Mouth/Throat: Oropharynx is clear and moist. No oropharyngeal exudate.  Eyes: Conjunctivae and EOM are normal. Pupils are equal, round, and reactive to light.  Neck: Normal range of motion. Neck supple.  Cardiovascular: Normal rate, regular rhythm, normal heart sounds and intact distal pulses.  Exam reveals no gallop and no  friction rub.   No murmur heard. Pulmonary/Chest: Effort normal and breath sounds normal. No respiratory distress. He has no wheezes.  Abdominal: Soft. Bowel sounds are normal. He exhibits no distension. There is no tenderness. There is no rebound and no guarding.  Musculoskeletal: Normal range of motion. He exhibits no edema and no tenderness.  Neurological: He is alert and oriented to person, place, and time.  Skin: Skin is warm and dry.  Psychiatric: He has a normal mood and affect. His behavior is normal.    ED Course  Procedures (including critical care time) Labs Review Labs Reviewed  COMPREHENSIVE METABOLIC PANEL - Abnormal; Notable for the following:    Total Bilirubin <0.2 (*)    All other components within normal limits  CBC WITH DIFFERENTIAL  LIPASE, BLOOD  OCCULT BLOOD X 1 CARD TO LAB, STOOL   Imaging Review No results found.   EKG Interpretation None      MDM   Final diagnoses:  Flu-like symptoms    3:07 PM 43 y.o. male who presents with nausea, diarrhea, abdominal cramping, body aches which began yesterday evening. The patient states that his child and wife have had the flu earlier this week. He denies any fevers at home. He is afebrile and hypertensive here. He has some mild abdominal cramping on exam but notes that mostly his cramping is relieved after a bowel movement. He notes that he did see a dark stool last night. Will perform screening lab work and Hemoccult stool. My suspicion is that this is likely a viral syndrome such as influenza.  Hemeoccult neg, brown stool.   4:11 PM: I interpreted/reviewed the labs and/or imaging which were non-contributory. Pt feeling better.  I have discussed the diagnosis/risks/treatment options with the patient and believe the pt to be eligible for discharge home to follow-up with pcp as needed. We also discussed returning to the ED immediately if new or worsening sx occur. We discussed the sx which are most concerning (e.g.,  worsening abd pain, inability to tolerate po) that necessitate immediate return. Medications administered to the patient during their visit and any new prescriptions provided to the patient are listed below.  Medications given during this visit Medications  sodium chloride 0.9 % bolus 1,000 mL (1,000 mLs Intravenous New Bag/Given 10/16/13 1522)  oseltamivir (TAMIFLU) capsule 75 mg (not administered)  morphine 4 MG/ML injection 4 mg (4 mg Intravenous Given 10/16/13 1522)  ondansetron (ZOFRAN) injection 4 mg (4 mg Intravenous Given 10/16/13 1522)    Discharge Medication List as of 10/16/2013  4:13 PM    START taking these medications   Details  ondansetron (ZOFRAN ODT) 4 MG disintegrating tablet 4mg  ODT q4 hours prn nausea/vomit, Print    oseltamivir (TAMIFLU) 75 MG capsule Take 1 capsule (75 mg total) by mouth every 12 (twelve) hours., Starting 10/16/2013, Until  Discontinued, Print    !! oxyCODONE-acetaminophen (PERCOCET) 5-325 MG per tablet Take 1 tablet by mouth every 6 (six) hours as needed for moderate pain., Starting 10/16/2013, Until Discontinued, Print     !! - Potential duplicate medications found. Please discuss with provider.       Blanchard Kelch, MD 10/17/13 [phone]

## 2013-10-16 NOTE — ED Notes (Signed)
Pt reports abdominal pain, nausea, diarrhea.  Onset last night.  Denies vomiting.  Also reports sinus congestion.

## 2013-12-23 ENCOUNTER — Emergency Department (HOSPITAL_BASED_OUTPATIENT_CLINIC_OR_DEPARTMENT_OTHER)
Admission: EM | Admit: 2013-12-23 | Discharge: 2013-12-23 | Disposition: A | Discharge status: Home / Self Care | Payer: Self-pay | Attending: Emergency Medicine | Admitting: Emergency Medicine

## 2013-12-23 ENCOUNTER — Encounter (HOSPITAL_BASED_OUTPATIENT_CLINIC_OR_DEPARTMENT_OTHER): Payer: Self-pay | Admitting: Emergency Medicine

## 2013-12-23 ENCOUNTER — Emergency Department (HOSPITAL_BASED_OUTPATIENT_CLINIC_OR_DEPARTMENT_OTHER): Payer: Self-pay

## 2013-12-23 DIAGNOSIS — Z79899 Other long term (current) drug therapy: Secondary | ICD-10-CM | POA: Insufficient documentation

## 2013-12-23 DIAGNOSIS — I10 Essential (primary) hypertension: Secondary | ICD-10-CM | POA: Insufficient documentation

## 2013-12-23 DIAGNOSIS — R000 Tachycardia, unspecified: Secondary | ICD-10-CM | POA: Insufficient documentation

## 2013-12-23 DIAGNOSIS — J441 Chronic obstructive pulmonary disease with (acute) exacerbation: Secondary | ICD-10-CM | POA: Insufficient documentation

## 2013-12-23 DIAGNOSIS — Z792 Long term (current) use of antibiotics: Secondary | ICD-10-CM | POA: Insufficient documentation

## 2013-12-23 DIAGNOSIS — R079 Chest pain, unspecified: Secondary | ICD-10-CM | POA: Insufficient documentation

## 2013-12-23 DIAGNOSIS — Z87442 Personal history of urinary calculi: Secondary | ICD-10-CM | POA: Insufficient documentation

## 2013-12-23 DIAGNOSIS — F17200 Nicotine dependence, unspecified, uncomplicated: Secondary | ICD-10-CM | POA: Insufficient documentation

## 2013-12-23 DIAGNOSIS — M546 Pain in thoracic spine: Secondary | ICD-10-CM | POA: Insufficient documentation

## 2013-12-23 HISTORY — DX: Essential (primary) hypertension: I10

## 2013-12-23 LAB — URINALYSIS, ROUTINE W REFLEX MICROSCOPIC
Bilirubin Urine: NEGATIVE
Glucose, UA: NEGATIVE mg/dL
Hgb urine dipstick: NEGATIVE
KETONES UR: NEGATIVE mg/dL
Leukocytes, UA: NEGATIVE
Nitrite: NEGATIVE
Protein, ur: NEGATIVE mg/dL
Specific Gravity, Urine: 1.028 (ref 1.005–1.030)
UROBILINOGEN UA: 1 mg/dL (ref 0.0–1.0)
pH: 7 (ref 5.0–8.0)

## 2013-12-23 LAB — COMPREHENSIVE METABOLIC PANEL
ALBUMIN: 4.2 g/dL (ref 3.5–5.2)
ALT: 8 U/L (ref 0–53)
AST: 16 U/L (ref 0–37)
Alkaline Phosphatase: 72 U/L (ref 39–117)
BILIRUBIN TOTAL: 0.3 mg/dL (ref 0.3–1.2)
BUN: 8 mg/dL (ref 6–23)
CO2: 27 meq/L (ref 19–32)
CREATININE: 0.9 mg/dL (ref 0.50–1.35)
Calcium: 9.6 mg/dL (ref 8.4–10.5)
Chloride: 104 mEq/L (ref 96–112)
GFR calc Af Amer: >90 mL/min (ref >90)
Glucose, Bld: 87 mg/dL (ref 70–99)
Potassium: 4 mEq/L (ref 3.7–5.3)
Sodium: 144 mEq/L (ref 137–147)
Total Protein: 7.6 g/dL (ref 6.0–8.3)

## 2013-12-23 LAB — CBC
HEMATOCRIT: 43.9 % (ref 39.0–52.0)
Hemoglobin: 15.3 g/dL (ref 13.0–17.0)
MCH: 31.2 pg (ref 26.0–34.0)
MCHC: 34.9 g/dL (ref 30.0–36.0)
MCV: 89.6 fL (ref 78.0–100.0)
Platelets: 347 10*3/uL (ref 150–400)
RBC: 4.9 MIL/uL (ref 4.22–5.81)
RDW: 13.2 % (ref 11.5–15.5)
WBC: 9.9 10*3/uL (ref 4.0–10.5)

## 2013-12-23 LAB — TROPONIN I: Troponin I: <0.3 ng/mL (ref <0.30)

## 2013-12-23 LAB — D-DIMER, QUANTITATIVE: D-Dimer, Quant: 0.42 ug/mL-FEU (ref 0.00–0.48)

## 2013-12-23 LAB — PROTIME-INR
INR: 0.98 (ref 0.00–1.49)
PROTHROMBIN TIME: 12.8 s (ref 11.6–15.2)

## 2013-12-23 LAB — APTT: aPTT: 28 seconds (ref 24–37)

## 2013-12-23 MED ORDER — HYDROMORPHONE HCL PF 1 MG/ML IJ SOLN
INTRAMUSCULAR | Status: AC
  Filled 2013-12-23: qty 1

## 2013-12-23 MED ORDER — IOHEXOL 350 MG/ML SOLN
100.0000 mL | Freq: Once | INTRAVENOUS | Status: AC | PRN
  Administered 2013-12-23: 100 mL via INTRAVENOUS

## 2013-12-23 MED ORDER — HYDROMORPHONE HCL PF 1 MG/ML IJ SOLN
1.0000 mg | Freq: Once | INTRAMUSCULAR | Status: AC
  Administered 2013-12-23: 1 mg via INTRAVENOUS
  Filled 2013-12-23: qty 1

## 2013-12-23 MED ORDER — SODIUM CHLORIDE 0.9 % IV SOLN
20.0000 mL | INTRAVENOUS | Status: DC
  Administered 2013-12-23: 1000 mL via INTRAVENOUS

## 2013-12-23 MED ORDER — NITROGLYCERIN 0.4 MG SL SUBL: 0.4000 mg | SUBLINGUAL_TABLET | SUBLINGUAL | Status: DC | PRN

## 2013-12-23 MED ORDER — HYDROMORPHONE HCL PF 1 MG/ML IJ SOLN
1.0000 mg | Freq: Once | INTRAMUSCULAR | Status: AC
  Administered 2013-12-23: 1 mg via INTRAVENOUS

## 2013-12-23 NOTE — Discharge Instructions (Signed)
Chest Pain (Nonspecific) °It is often hard to give a specific diagnosis for the cause of chest pain. There is always a chance that your pain could be related to something serious, such as a heart attack or a blood clot in the lungs. You need to follow up with your caregiver for further evaluation. °CAUSES  °· Heartburn. °· Pneumonia or bronchitis. °· Anxiety or stress. °· Inflammation around your heart (pericarditis) or lung (pleuritis or pleurisy). °· A blood clot in the lung. °· A collapsed lung (pneumothorax). It can develop suddenly on its own (spontaneous pneumothorax) or from injury (trauma) to the chest. °· Shingles infection (herpes zoster virus). °The chest wall is composed of bones, muscles, and cartilage. Any of these can be the source of the pain. °· The bones can be bruised by injury. °· The muscles or cartilage can be strained by coughing or overwork. °· The cartilage can be affected by inflammation and become sore (costochondritis). °DIAGNOSIS  °Lab tests or other studies, such as X-rays, electrocardiography, stress testing, or cardiac imaging, may be needed to find the cause of your pain.  °TREATMENT  °· Treatment depends on what may be causing your chest pain. Treatment may include: °· Acid blockers for heartburn. °· Anti-inflammatory medicine. °· Pain medicine for inflammatory conditions. °· Antibiotics if an infection is present. °· You may be advised to change lifestyle habits. This includes stopping smoking and avoiding alcohol, caffeine, and chocolate. °· You may be advised to keep your head raised (elevated) when sleeping. This reduces the chance of acid going backward from your stomach into your esophagus. °· Most of the time, nonspecific chest pain will improve within 2 to 3 days with rest and mild pain medicine. °HOME CARE INSTRUCTIONS  °· If antibiotics were prescribed, take your antibiotics as directed. Finish them even if you start to feel better. °· For the next few days, avoid physical  activities that bring on chest pain. Continue physical activities as directed. °· Do not smoke. °· Avoid drinking alcohol. °· Only take over-the-counter or prescription medicine for pain, discomfort, or fever as directed by your caregiver. °· Follow your caregiver's suggestions for further testing if your chest pain does not go away. °· Keep any follow-up appointments you made. If you do not go to an appointment, you could develop lasting (chronic) problems with pain. If there is any problem keeping an appointment, you must call to reschedule. °SEEK MEDICAL CARE IF:  °· You think you are having problems from the medicine you are taking. Read your medicine instructions carefully. °· Your chest pain does not go away, even after treatment. °· You develop a rash with blisters on your chest. °SEEK IMMEDIATE MEDICAL CARE IF:  °· You have increased chest pain or pain that spreads to your arm, neck, jaw, back, or abdomen. °· You develop shortness of breath, an increasing cough, or you are coughing up blood. °· You have severe back or abdominal pain, feel nauseous, or vomit. °· You develop severe weakness, fainting, or chills. °· You have a fever. °THIS IS AN EMERGENCY. Do not wait to see if the pain will go away. Get medical help at once. Call your local emergency services (911 in [place] Do not drive yourself to the hospital. °MAKE SURE YOU:  °· Understand these instructions. °· Will watch your condition. °· Will get help right away if you are not doing well or get worse. °Document Released: 04/10/2005 Document Revised: 09/23/2011 Document Reviewed: 02/04/2008 °[employer]® Patient Information ©2014 [employer]   [employer]. ° °

## 2013-12-23 NOTE — ED Provider Notes (Signed)
CSN: [id number]     Arrival date & time 12/23/13  1556 History   First MD Initiated Contact with Patient 12/23/13 1614     Chief Complaint  Patient presents with  . Chest Pain     (Consider location/radiation/quality/duration/timing/severity/associated sxs/prior Treatment) HPI 43 year old male presents today complaining of sudden onset of sharp severe left-sided upper back pain radiating to his neck and anteriorly to his chest. He describes this as similar to previous spontaneous pneumothorax. They said it started when he was picking something up. It has been continuous in nature since then. He has not taken anything for this. He feels somewhat short of breath. He is a smoker and has a history of COPD and continues to smoke. He denies any fever chills or increase in sputum production. Denies any direct trauma to his chest. Past Medical History  Diagnosis Date  . Spontaneous pneumothorax   . Kidney calculi   . COPD (chronic obstructive pulmonary disease)   . Hypertension    Past Surgical History  Procedure Laterality Date  . Cystoscopy    . Lithotripsy    . Lung surgery Left     pt states "cut out 2% of the bottom"  . Chest tube insertion     No family history on file. History  Substance Use Topics  . Smoking status: Current Every Day Smoker -- 1.00 packs/day    Types: Cigarettes  . Smokeless tobacco: Never Used  . Alcohol Use: No    Review of Systems  All other systems reviewed and are negative.     Allergies  Review of patient's allergies indicates no known allergies.  Home Medications   Prior to Admission medications   Medication Sig Start Date End Date Taking? Authorizing Provider  HYDROcodone-acetaminophen (NORCO) 5-325 MG per tablet Take 2 tablets by mouth every 4 (four) hours as needed. 09/02/13   Veryl Speak, MD  lisinopril (PRINIVIL,ZESTRIL) 10 MG tablet Take 1 tablet (10 mg total) by mouth daily. 03/20/13   Antonietta Breach, PA-C  ondansetron (ZOFRAN ODT) 4 MG  disintegrating tablet 4mg  ODT q4 hours prn nausea/vomit 10/16/13   Blanchard Kelch, MD  oseltamivir (TAMIFLU) 75 MG capsule Take 1 capsule (75 mg total) by mouth every 12 (twelve) hours. 10/16/13   Blanchard Kelch, MD  oxyCODONE-acetaminophen (PERCOCET) 5-325 MG per tablet Take 2 tablets by mouth every 4 (four) hours as needed. 07/25/13   [name], MD  oxyCODONE-acetaminophen (PERCOCET) 5-325 MG per tablet Take 1 tablet by mouth every 6 (six) hours as needed for moderate pain. 10/16/13   Blanchard Kelch, MD  oxyCODONE-acetaminophen (PERCOCET/ROXICET) 5-325 MG per tablet Take 1 tablet by mouth every 4 (four) hours as needed for severe pain. 06/05/13   Shari A Upstill, PA-C  penicillin v potassium (VEETID) 500 MG tablet Take 1 tablet (500 mg total) by mouth 3 (three) times daily. 06/05/13   Shari A Upstill, PA-C   BP 173/108  Pulse 97  Temp(Src) 97.8 F (36.6 C) (Oral)  Resp 16  Ht 5\' 7"  (1.702 m)  Wt 130 lb (58.968 kg)  BMI 20.36 kg/m2  SpO2 100% Physical Exam  Nursing note and vitals reviewed. Constitutional: He is oriented to person, place, and time. He appears well-developed.  Uncomfortable appearing male Body habitus is thin  HENT:  Head: Normocephalic and atraumatic.  Right Ear: External ear normal.  Left Ear: External ear normal.  Nose: Nose normal.  Mouth/Throat: Oropharynx is clear and moist.  Eyes: Conjunctivae and EOM are  normal. Pupils are equal, round, and reactive to light.  Neck: Normal range of motion. Neck supple.  Cardiovascular: Tachycardia present.   Pulmonary/Chest: Effort normal and breath sounds normal. No respiratory distress.  Abdominal: Soft. Bowel sounds are normal.  Musculoskeletal: Normal range of motion. He exhibits no edema and no tenderness.  Neurological: He is alert and oriented to person, place, and time. He has normal reflexes.  Skin: Skin is warm and dry.  Psychiatric: He has a normal mood and affect. His behavior is normal. Judgment and  thought content normal.    ED Course  Procedures (including critical care time) Labs Review Labs Reviewed  APTT  CBC  COMPREHENSIVE METABOLIC PANEL  PROTIME-INR  URINALYSIS, ROUTINE W REFLEX MICROSCOPIC  D-DIMER, QUANTITATIVE  TROPONIN I    Imaging Review Dg Chest 2 View  12/23/2013   CLINICAL DATA:  Shortness of breath.  EXAM: CHEST  2 VIEW  COMPARISON:  Multiple prior studies. The most recent chest x-rays 09/02/2013.  FINDINGS: The cardiac silhouette, mediastinal and hilar contours are within normal limits and stable. There is elevation of both pulmonary hila due to severe upper lobe scarring changes. Stable emphysema and pulmonary scarring. No definite acute overlying pulmonary process. No pleural effusion. No worrisome mass. The bony thorax is intact.  IMPRESSION: Chronic lung changes with emphysema and pulmonary scarring. No definite acute overlying pulmonary process.   Electronically Signed   By: Kalman Jewels M.D.   On: 12/23/2013 17:05   Ct Angio Chest Pe W/cm &/or Wo Cm  12/23/2013   CLINICAL DATA:  severe chest pain radiating to left neck  EXAM: CT ANGIOGRAPHY CHEST WITH CONTRAST  TECHNIQUE: Multidetector CT imaging of the chest was performed using the standard protocol during bolus administration of intravenous contrast. Multiplanar CT image reconstructions and MIPs were obtained to evaluate the vascular anatomy.  CONTRAST:  193mL OMNIPAQUE IOHEXOL 350 MG/ML SOLN  COMPARISON:  01/09/2013  FINDINGS: There are no filling defects in the pulmonary arterial tree to suggest acute pulmonary thromboembolism.  Small mediastinal nodes.  No pneumothorax.  No pleural effusion.  Fibrotic changes with a prep collection for the lung apices are stable.  No acute bony deformity.  Review of the MIP images confirms the above findings.  IMPRESSION: No evidence of acute pulmonary thromboembolism.  Fibrotic changes in the lungs are stable with a prep collection for the lung apices.   Electronically  Signed   By: Maryclare Bean M.D.   On: 12/23/2013 18:32     EKG Interpretation   Date/Time:  Thursday December 23 2013 19:21:59 EDT Ventricular Rate:  64 PR Interval:  168 QRS Duration: 88 QT Interval:  388 QTC Calculation: 400 R Axis:   89 Text Interpretation:  Normal sinus rhythm Possible Left atrial enlargement  Left ventricular hypertrophy Abnormal ECG Non-specific ST-t changes  Confirmed by Shakelia Scrivner MD, Andee Poles [phone]) on 12/23/2013 7:29:00 PM      EKG Interpretation  Date/Time:  Thursday December 23 2013 19:21:59 EDT Ventricular Rate:  64 PR Interval:  168 QRS Duration: 88 QT Interval:  388 QTC Calculation: 400 R Axis:   89 Text Interpretation:  Normal sinus rhythm Possible Left atrial enlargement Left ventricular hypertrophy Abnormal ECG Non-specific ST-t changes Confirmed by Lysander Calixte MD, Lynda Wanninger ([id number]) on 12/23/2013 7:29:00 PM      I  Labs Reviewed  APTT  CBC  COMPREHENSIVE METABOLIC PANEL  PROTIME-INR  URINALYSIS, ROUTINE W REFLEX MICROSCOPIC  D-DIMER, QUANTITATIVE  TROPONIN I   Dg Chest 2 View  12/23/2013   CLINICAL DATA:  Shortness of breath.  EXAM: CHEST  2 VIEW  COMPARISON:  Multiple prior studies. The most recent chest x-rays 09/02/2013.  FINDINGS: The cardiac silhouette, mediastinal and hilar contours are within normal limits and stable. There is elevation of both pulmonary hila due to severe upper lobe scarring changes. Stable emphysema and pulmonary scarring. No definite acute overlying pulmonary process. No pleural effusion. No worrisome mass. The bony thorax is intact.  IMPRESSION: Chronic lung changes with emphysema and pulmonary scarring. No definite acute overlying pulmonary process.   Electronically Signed   By: Kalman Jewels M.D.   On: 12/23/2013 17:05   Ct Angio Chest Pe W/cm &/or Wo Cm  12/23/2013   CLINICAL DATA:  severe chest pain radiating to left neck  EXAM: CT ANGIOGRAPHY CHEST WITH CONTRAST  TECHNIQUE: Multidetector CT imaging of the chest was performed using  the standard protocol during bolus administration of intravenous contrast. Multiplanar CT image reconstructions and MIPs were obtained to evaluate the vascular anatomy.  CONTRAST:  160mL OMNIPAQUE IOHEXOL 350 MG/ML SOLN  COMPARISON:  01/09/2013  FINDINGS: There are no filling defects in the pulmonary arterial tree to suggest acute pulmonary thromboembolism.  Small mediastinal nodes.  No pneumothorax.  No pleural effusion.  Fibrotic changes with a prep collection for the lung apices are stable.  No acute bony deformity.  Review of the MIP images confirms the above findings.  IMPRESSION: No evidence of acute pulmonary thromboembolism.  Fibrotic changes in the lungs are stable with a prep collection for the lung apices.   Electronically Signed   By: Maryclare Bean M.D.   On: 12/23/2013 18:32  nitial EKG shows sinus tachycardia with no evidence of acute ischemia. Initial EKG ventricular rate 105 rightward axis deviation PR interval 182 QRS duration 82 left ventricular hypertrophy no evidence of acute ischemia\ Chest x-Tanmay Halteman performed did not show any evidence of pneumothorax. Initial labs including troponin normal. CT image of the chest shows no evidence of pulmonary thromboembolism. He should given IV fluids and Dilaudid with heart rate decreased to the 60s. He has remained hemodynamically stable here and continues to complain of some pain in his chest. He is having a six-hour troponin done at 8 PM.  MDM   Final diagnoses:  Chest pain    This is a 43 year old male with a history of pneumothorax who presents with sudden onset of chest pain today worsening with certain positions. It began while lifting a heavy object. He has had chest x-Samvel Zinn, CT scan to the chest, and EKG x2. His EKG is nonischemic. CT of the chest did not reveal any evidence of pulmonary embolism or other pulmonary disease. He is having a repeat of his and done if this is normal he'll be discharged home to follow up with primary care physician.   Review of drug data base reveals multiple narcotic prescriptions from different providers 8 in 6 months raising some concern for presentation with narcotics withdrawal.     Shaune Pollack, MD 12/23/13 2048

## 2013-12-23 NOTE — ED Notes (Signed)
Pt was lifting a heavy object today and had a sudden onset of left chest pain radiating down to his left abdomen and upper middle back. Pt had left pneuomothorax 2.5 years ago.

## 2013-12-23 NOTE — ED Notes (Signed)
EDP verbal order to hold ASA and NTG at this time

## 2013-12-23 NOTE — ED Notes (Signed)
Troponin 1 added to labs drawn at 1634

## 2013-12-23 NOTE — ED Notes (Signed)
Pt states he was dropped off by his wife-states he will call for ride

## 2013-12-23 NOTE — ED Notes (Signed)
Patient transported to CT 

## 2014-03-29 ENCOUNTER — Emergency Department (HOSPITAL_BASED_OUTPATIENT_CLINIC_OR_DEPARTMENT_OTHER)
Admission: EM | Admit: 2014-03-29 | Discharge: 2014-03-29 | Disposition: A | Discharge status: Home / Self Care | Payer: Self-pay | Attending: Emergency Medicine | Admitting: Emergency Medicine

## 2014-03-29 ENCOUNTER — Emergency Department (HOSPITAL_BASED_OUTPATIENT_CLINIC_OR_DEPARTMENT_OTHER): Payer: Self-pay

## 2014-03-29 DIAGNOSIS — R42 Dizziness and giddiness: Secondary | ICD-10-CM | POA: Insufficient documentation

## 2014-03-29 DIAGNOSIS — T502X5A Adverse effect of carbonic-anhydrase inhibitors, benzothiadiazides and other diuretics, initial encounter: Secondary | ICD-10-CM | POA: Insufficient documentation

## 2014-03-29 DIAGNOSIS — Z87442 Personal history of urinary calculi: Secondary | ICD-10-CM | POA: Insufficient documentation

## 2014-03-29 DIAGNOSIS — I10 Essential (primary) hypertension: Secondary | ICD-10-CM | POA: Insufficient documentation

## 2014-03-29 DIAGNOSIS — R5381 Other malaise: Secondary | ICD-10-CM | POA: Insufficient documentation

## 2014-03-29 DIAGNOSIS — J449 Chronic obstructive pulmonary disease, unspecified: Secondary | ICD-10-CM | POA: Insufficient documentation

## 2014-03-29 DIAGNOSIS — Z792 Long term (current) use of antibiotics: Secondary | ICD-10-CM | POA: Insufficient documentation

## 2014-03-29 DIAGNOSIS — R5383 Other fatigue: Secondary | ICD-10-CM

## 2014-03-29 DIAGNOSIS — T50905A Adverse effect of unspecified drugs, medicaments and biological substances, initial encounter: Secondary | ICD-10-CM

## 2014-03-29 DIAGNOSIS — F17200 Nicotine dependence, unspecified, uncomplicated: Secondary | ICD-10-CM | POA: Insufficient documentation

## 2014-03-29 DIAGNOSIS — Z79899 Other long term (current) drug therapy: Secondary | ICD-10-CM | POA: Insufficient documentation

## 2014-03-29 DIAGNOSIS — J4489 Other specified chronic obstructive pulmonary disease: Secondary | ICD-10-CM | POA: Insufficient documentation

## 2014-03-29 LAB — BASIC METABOLIC PANEL
Anion gap: 14 (ref 5–15)
BUN: 20 mg/dL (ref 6–23)
CO2: 29 mEq/L (ref 19–32)
CREATININE: 1 mg/dL (ref 0.50–1.35)
Calcium: 10.3 mg/dL (ref 8.4–10.5)
Chloride: 95 mEq/L — ABNORMAL LOW (ref 96–112)
GFR calc Af Amer: >90 mL/min (ref >90)
GFR calc non Af Amer: 90 mL/min — ABNORMAL LOW (ref >90)
Glucose, Bld: 128 mg/dL — ABNORMAL HIGH (ref 70–99)
Potassium: 4.1 mEq/L (ref 3.7–5.3)
Sodium: 138 mEq/L (ref 137–147)

## 2014-03-29 LAB — CBC
HCT: 46.7 % (ref 39.0–52.0)
Hemoglobin: 15.9 g/dL (ref 13.0–17.0)
MCH: 31.5 pg (ref 26.0–34.0)
MCHC: 34 g/dL (ref 30.0–36.0)
MCV: 92.7 fL (ref 78.0–100.0)
PLATELETS: 307 10*3/uL (ref 150–400)
RBC: 5.04 MIL/uL (ref 4.22–5.81)
RDW: 13.9 % (ref 11.5–15.5)
WBC: 9.6 10*3/uL (ref 4.0–10.5)

## 2014-03-29 LAB — TROPONIN I: Troponin I: <0.3 ng/mL (ref <0.30)

## 2014-03-29 MED ORDER — MECLIZINE HCL 25 MG PO TABS
25.0000 mg | ORAL_TABLET | Freq: Once | ORAL | Status: AC
  Administered 2014-03-29: 25 mg via ORAL
  Filled 2014-03-29: qty 1

## 2014-03-29 NOTE — ED Provider Notes (Signed)
Medical screening examination/treatment/procedure(s) were performed by non-physician practitioner and as supervising physician I was immediately available for consultation/collaboration.   EKG Interpretation   Date/Time:  Tuesday March 29 2014 17:03:27 EDT Ventricular Rate:  80 PR Interval:  146 QRS Duration: 88 QT Interval:  350 QTC Calculation: 403 R Axis:   83 Text Interpretation:  Normal sinus rhythm Possible Left atrial enlargement  Left ventricular hypertrophy Abnormal ECG No significant change since last  tracing Confirmed by Canary Brim  MD, Salvadore Valvano [phone]) on 03/29/2014 6:14:33 PM       Threasa Beards, MD 03/29/14 2015

## 2014-03-29 NOTE — ED Notes (Signed)
Pt. Reports last night at work he started feeling weak and dizzy at work.  Pt. Reports reports he started having sweats and and weak spells with pain  Off and on in the L  Lung area. And L rib area.  Pt. Reports Pain in the L rib area as sore off and on.  No resp. Difficulty noted.

## 2014-03-29 NOTE — ED Provider Notes (Signed)
CSN: [id number]     Arrival date & time 03/29/14  1623 History   First MD Initiated Contact with Patient 03/29/14 1647     Chief Complaint  Patient presents with  . 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 States that he got very sweating but didn't have any pain. Some sob. Pt states that today with change bending over he gets a room spinning sensation. Hasn't taken anything for the symptoms. Was started on hctz last week.  The history is provided by the patient. No language interpreter was used.    Past Medical History  Diagnosis Date  . Spontaneous pneumothorax   . Kidney calculi   . COPD (chronic obstructive pulmonary disease)   . Hypertension    Past Surgical History  Procedure Laterality Date  . Cystoscopy    . Lithotripsy    . Lung surgery Left     pt states "cut out 2% of the bottom"  . Chest tube insertion     No family history on file. History  Substance Use Topics  . Smoking status: Current Every Day Smoker -- 1.00 packs/day    Types: Cigarettes  . Smokeless tobacco: Never Used  . Alcohol Use: No    Review of Systems  Constitutional: Negative.   Respiratory: Negative.   Neurological: Positive for dizziness.      Allergies  Review of patient's allergies indicates no known allergies.  Home Medications   Prior to Admission medications   Medication Sig Start Date End Date Taking? Authorizing Provider  hydrochlorothiazide (HYDRODIURIL) 25 MG tablet Take 25 mg by mouth daily.   Yes Historical Provider, MD  HYDROcodone-acetaminophen (NORCO) 5-325 MG per tablet Take 2 tablets by mouth every 4 (four) hours as needed. 09/02/13   Veryl Speak, MD  lisinopril (PRINIVIL,ZESTRIL) 10 MG tablet Take 1 tablet (10 mg total) by mouth daily. 03/20/13   Antonietta Breach, PA-C  ondansetron (ZOFRAN ODT) 4 MG  disintegrating tablet 4mg  ODT q4 hours prn nausea/vomit 10/16/13   Pamella Pert, MD  oseltamivir (TAMIFLU) 75 MG capsule Take 1 capsule (75 mg total) by mouth every 12 (twelve) hours. 10/16/13   Pamella Pert, MD  oxyCODONE-acetaminophen (PERCOCET) 5-325 MG per tablet Take 2 tablets by mouth every 4 (four) hours as needed. 07/25/13   [name], MD  oxyCODONE-acetaminophen (PERCOCET) 5-325 MG per tablet Take 1 tablet by mouth every 6 (six) hours as needed for moderate pain. 10/16/13   Pamella Pert, MD  oxyCODONE-acetaminophen (PERCOCET/ROXICET) 5-325 MG per tablet Take 1 tablet by mouth every 4 (four) hours as needed for severe pain. 06/05/13   Shari A Upstill, PA-C  penicillin v potassium (VEETID) 500 MG tablet Take 1 tablet (500 mg total) by mouth 3 (three) times daily. 06/05/13   Shari A Upstill, PA-C   BP 131/98  Pulse 92  Temp(Src) 98 F (36.7 C) (Oral)  Resp 20  Ht 5\' 6"  (1.676 m)  Wt 120 lb (54.432 kg)  BMI 19.38 kg/m2  SpO2 99% Physical Exam  Nursing note and vitals reviewed. Constitutional: He is oriented to person, place, and time. He appears well-developed and well-nourished.  HENT:  Head: Normocephalic and atraumatic.  Right Ear: External ear normal.  Left Ear: External ear normal.  Eyes: Conjunctivae and EOM are normal. Pupils are equal, round, and reactive to light.  Neck: Normal range of motion. Neck supple.  Cardiovascular: Normal  rate and regular rhythm.   Pulmonary/Chest: Effort normal and breath sounds normal.  Abdominal: Soft. Bowel sounds are normal.  Musculoskeletal: Normal range of motion.  Neurological: He is alert and oriented to person, place, and time. He exhibits normal muscle tone. Coordination normal.  Negative romberg  Skin: Skin is warm and dry.  Psychiatric: He has a normal mood and affect.    ED Course  Procedures (including critical care time) Labs Review Labs Reviewed  BASIC METABOLIC PANEL - Abnormal; Notable for the following:     Chloride 95 (*)    Glucose, Bld 128 (*)    GFR calc non Af Amer 90 (*)    All other components within normal limits  TROPONIN I  CBC    Imaging Review Dg Chest 2 View  03/29/2014   CLINICAL DATA:  Chest pain  EXAM: CHEST  2 VIEW  COMPARISON:  12/23/2013  FINDINGS: Hyperaeration, upper lobe fibrotic changes, and upper lobe cicatrization is stable. No pneumothorax. No pleural effusion. Normal heart size.  IMPRESSION: Chronic lung disease.   Electronically Signed   By: Maryclare Bean M.D.   On: 03/29/2014 17:22   Ct Head Wo Contrast  03/29/2014   CLINICAL DATA:  Fatigue.  Dizziness.  Confusion.  EXAM: CT HEAD WITHOUT CONTRAST  TECHNIQUE: Contiguous axial images were obtained from the base of the skull through the vertex without intravenous contrast.  COMPARISON:  Report from 07/03/2013  FINDINGS: The brainstem, cerebellum, cerebral peduncles, thalamus, basal ganglia, basilar cisterns, and ventricular system appear within normal limits. Mild chronic left ethmoid sinusitis.  No intracranial hemorrhage, mass lesion, or acute CVA.  IMPRESSION: 1. No acute intracranial findings. 2. Mild chronic ethmoid sinusitis.   Electronically Signed   By: Sherryl Barters M.D.   On: 03/29/2014 18:55     EKG Interpretation   Date/Time:  Tuesday March 29 2014 17:03:27 EDT Ventricular Rate:  80 PR Interval:  146 QRS Duration: 88 QT Interval:  350 QTC Calculation: 403 R Axis:   83 Text Interpretation:  Normal sinus rhythm Possible Left atrial enlargement  Left ventricular hypertrophy Abnormal ECG No significant change since last  tracing Confirmed by Canary Brim  MD, MARTHA [phone]) on 03/29/2014 6:14:33 PM      MDM   Final diagnoses:  Dizziness  Medication side effect, initial encounter    Pt is neurologically intact. Ambulating without any problem. Likely side effect of hctz. Discussed with importance of finding a pcp    Glendell Docker, NP 03/29/14 2012

## 2014-03-29 NOTE — ED Notes (Signed)
Pt walked to xray

## 2014-03-29 NOTE — Discharge Instructions (Signed)

## 2014-03-29 NOTE — ED Notes (Signed)
Pt. Started HCTZ 1 wk ago.  Was started on this by ED Dr. At [facility]

## 2014-03-29 NOTE — ED Notes (Signed)
PT returned from xray

## 2014-07-24 ENCOUNTER — Encounter (HOSPITAL_BASED_OUTPATIENT_CLINIC_OR_DEPARTMENT_OTHER): Payer: Self-pay | Admitting: Emergency Medicine

## 2014-07-24 ENCOUNTER — Emergency Department (HOSPITAL_BASED_OUTPATIENT_CLINIC_OR_DEPARTMENT_OTHER)
Admission: EM | Admit: 2014-07-24 | Discharge: 2014-07-24 | Disposition: A | Discharge status: Home / Self Care | Payer: Self-pay | Attending: Emergency Medicine | Admitting: Emergency Medicine

## 2014-07-24 DIAGNOSIS — K029 Dental caries, unspecified: Secondary | ICD-10-CM | POA: Insufficient documentation

## 2014-07-24 DIAGNOSIS — Z720 Tobacco use: Secondary | ICD-10-CM | POA: Insufficient documentation

## 2014-07-24 DIAGNOSIS — Z87442 Personal history of urinary calculi: Secondary | ICD-10-CM | POA: Insufficient documentation

## 2014-07-24 DIAGNOSIS — K088 Other specified disorders of teeth and supporting structures: Secondary | ICD-10-CM | POA: Insufficient documentation

## 2014-07-24 DIAGNOSIS — Z79899 Other long term (current) drug therapy: Secondary | ICD-10-CM | POA: Insufficient documentation

## 2014-07-24 DIAGNOSIS — J449 Chronic obstructive pulmonary disease, unspecified: Secondary | ICD-10-CM | POA: Insufficient documentation

## 2014-07-24 DIAGNOSIS — Z792 Long term (current) use of antibiotics: Secondary | ICD-10-CM | POA: Insufficient documentation

## 2014-07-24 DIAGNOSIS — I10 Essential (primary) hypertension: Secondary | ICD-10-CM | POA: Insufficient documentation

## 2014-07-24 MED ORDER — OXYCODONE-ACETAMINOPHEN 5-325 MG PO TABS: ORAL_TABLET | ORAL | Status: AC

## 2014-07-24 MED ORDER — AMOXICILLIN 500 MG PO CAPS: 500.0000 mg | ORAL_CAPSULE | Freq: Three times a day (TID) | ORAL | Status: AC

## 2014-07-24 MED ORDER — BUPIVACAINE-EPINEPHRINE (PF) 0.5% -1:200000 IJ SOLN
1.8000 mL | Freq: Once | INTRAMUSCULAR | Status: AC
  Administered 2014-07-24: 1.8 mL
  Filled 2014-07-24: qty 1.8

## 2014-07-24 MED ORDER — AMOXICILLIN 500 MG PO CAPS
500.0000 mg | ORAL_CAPSULE | Freq: Once | ORAL | Status: AC
Start: 2014-07-24 — End: 2014-07-24
  Administered 2014-07-24: 500 mg via ORAL
  Filled 2014-07-24: qty 1

## 2014-07-24 NOTE — Discharge Instructions (Signed)
Take percocet for breakthrough pain, do not drink alcohol, drive, care for children or do other critical tasks while taking percocet.  Return to the emergency room for fever, change in vision, redness to the face that rapidly spreads towards the eye, nausea or vomiting, difficulty swallowing or shortness of breath.   Apply warm compresses to jaw throughout the day.    Take your antibiotics as directed and to the end of the course. DO NOT drink alcohol when taking metronidazole, it will make you very sick!   Followup with a dentist is very important for ongoing evaluation and management of recurrent dental pain. Return to emergency department for emergent changing or worsening symptoms."  Low-cost dental clinic: [facility]  at [phone]**  **[place] at [phone] [address]**    You may also call [phone]  Dental Assistance If the dentist on-call cannot see you, please use the resources below:   Patients with Medicaid: [facility] [phone], [place] [address] [phone]  If unable to pay, or uninsured, contact [facility] [phone]) or [place] [phone] in [place], [place] in [facility]) to become qualified for the adult dental clinic  Other [place]- [place], [place], [place], [address]    [phone], Ext. [phone]    2nd and 4th Thursday of the month at 6:30am    10 clients each day by appointment, can sometimes see walk-in     patients if someone does not show for an appointment [address], [place], [place], [address]    [phone] [facility]- [address], [place], [place], [address]    [phone]  [place] Department- [phone] [place] [facility] Department- [phone]

## 2014-07-24 NOTE — ED Provider Notes (Signed)
CSN: [id number]     Arrival date & time 07/24/14  1302 History   First MD Initiated Contact with Patient 07/24/14 1353     Chief Complaint  Patient presents with  . Dental Pain     (Consider location/radiation/quality/duration/timing/severity/associated sxs/prior Treatment) HPI  Richard [name] is a 44 y.o. male complaining of recurrence of severe left upper dental pain, not alleviated by [employer], woke him from sleep at approximately 4 AM. Denies fever/chills, difficulty opening jaw, difficulty swallowing, SOB, gum swelling, facial swelling, neck swelling.   Past Medical History  Diagnosis Date  . Spontaneous pneumothorax   . Kidney calculi   . COPD (chronic obstructive pulmonary disease)   . Hypertension    Past Surgical History  Procedure Laterality Date  . Cystoscopy    . Lithotripsy    . Lung surgery Left     pt states "cut out 2% of the bottom"  . Chest tube insertion     No family history on file. History  Substance Use Topics  . Smoking status: Current Every Day Smoker -- 1.00 packs/day    Types: Cigarettes  . Smokeless tobacco: Never Used  . Alcohol Use: No    Review of Systems  10 systems reviewed and found to be negative, except as noted in the HPI.  Allergies  Review of patient's allergies indicates no known allergies.  Home Medications   Prior to Admission medications   Medication Sig Start Date End Date Taking? Authorizing Provider  amoxicillin (AMOXIL) 500 MG capsule Take 1 capsule (500 mg total) by mouth 3 (three) times daily. 07/24/14   Makaia Rappa, PA-C  hydrochlorothiazide (HYDRODIURIL) 25 MG tablet Take 25 mg by mouth daily.    Historical Provider, MD  HYDROcodone-acetaminophen (NORCO) 5-325 MG per tablet Take 2 tablets by mouth every 4 (four) hours as needed. 09/02/13   Veryl Speak, MD  lisinopril (PRINIVIL,ZESTRIL) 10 MG tablet Take 1 tablet (10 mg total) by mouth daily. 03/20/13   Antonietta Breach, PA-C  ondansetron (ZOFRAN ODT) 4 MG  disintegrating tablet 4mg  ODT q4 hours prn nausea/vomit 10/16/13   Pamella Pert, MD  oseltamivir (TAMIFLU) 75 MG capsule Take 1 capsule (75 mg total) by mouth every 12 (twelve) hours. 10/16/13   Pamella Pert, MD  oxyCODONE-acetaminophen (PERCOCET/ROXICET) 5-325 MG per tablet Take 1 tablet by mouth every 4 (four) hours as needed for severe pain. 06/05/13   Nehemiah Settle A Upstill, PA-C  oxyCODONE-acetaminophen (PERCOCET/ROXICET) 5-325 MG per tablet 1 to 2 tabs PO q6hrs  PRN for pain 07/24/14   Elmyra Ricks Cerinity Zynda, PA-C  penicillin v potassium (VEETID) 500 MG tablet Take 1 tablet (500 mg total) by mouth 3 (three) times daily. 06/05/13   Shari A Upstill, PA-C   BP 195/118 mmHg  Pulse 84  Temp(Src) 97.9 F (36.6 C) (Oral)  Resp 18  Ht 5\' 7"  (1.702 m)  Wt 124 lb (56.246 kg)  BMI 19.42 kg/m2  SpO2 98% Physical Exam  Constitutional: He is oriented to person, place, and time. He appears well-developed and well-nourished. No distress.  HENT:  Head: Normocephalic and atraumatic.  Mouth/Throat: Oropharynx is clear and moist.    Generally poor dentition, no gingival swelling, erythema or tenderness to palpation. Patient is handling their secretions. There is no tenderness to palpation or firmness underneath tongue bilaterally. No trismus.    Eyes: Conjunctivae and EOM are normal. Pupils are equal, round, and reactive to light.  Cardiovascular: Normal rate, regular rhythm and intact distal pulses.   Pulmonary/Chest: Effort normal and  breath sounds normal. No stridor.  Abdominal: Soft. Bowel sounds are normal.  Musculoskeletal: Normal range of motion.  Neurological: He is alert and oriented to person, place, and time.  Psychiatric: He has a normal mood and affect.  Nursing note and vitals reviewed.   ED Course  NERVE BLOCK Date/Time: 07/24/2014 3:39 PM Performed by: Monico Blitz Authorized by: Monico Blitz Consent: Verbal consent obtained. Consent given by: patient Required items:  required blood products, implants, devices, and special equipment available Patient identity confirmed: verbally with patient Indications: pain relief Body area: face/mouth Nerve: posterior superior alveolar Laterality: left Patient sedated: no Patient position: sitting Needle gauge: 27 G Local anesthetic: bupivacaine 0.5% without epinephrine Anesthetic total: 1.8 ml Outcome: pain improved Patient tolerance: Patient tolerated the procedure well with no immediate complications   (including critical care time) Labs Review Labs Reviewed - No data to display  Imaging Review No results found.   EKG Interpretation None      MDM   Final diagnoses:  Pain due to dental caries    Filed Vitals:   07/24/14 1307  BP: 195/118  Pulse: 84  Temp: 97.9 F (36.6 C)  TempSrc: Oral  Resp: 18  Height: 5\' 7"  (1.702 m)  Weight: 124 lb (56.246 kg)  SpO2: 98%    Medications  bupivacaine-epinephrine (MARCAINE W/ EPI) 0.5% -1:200000 injection 1.8 mL (not administered)  amoxicillin (AMOXIL) capsule 500 mg (500 mg Oral Given 07/24/14 1457)    Richard [name] is a pleasant 44 y.o. male presenting with dental pain associated with dental caries but no signs or symptoms of dental abscess. Nerve block given with excellent relief of pain. Blood pressure is elevated but I think that is because he is in a significant amount of pain. Patient afebrile, non toxic appearing and swallowing secretions well. I gave patient referral to dentist and stressed the importance of dental follow up for definitive management of dental issues. Patient voices understanding and is agreeable to plan.   Evaluation does not show pathology that would require ongoing emergent intervention or inpatient treatment. Pt is hemodynamically stable and mentating appropriately. Discussed findings and plan with patient/guardian, who agrees with care plan. All questions answered. Return precautions discussed and outpatient follow up  given.   New Prescriptions   AMOXICILLIN (AMOXIL) 500 MG CAPSULE    Take 1 capsule (500 mg total) by mouth 3 (three) times daily.   OXYCODONE-ACETAMINOPHEN (PERCOCET/ROXICET) 5-325 MG PER TABLET    1 to 2 tabs PO q6hrs  PRN for pain         Monico Blitz, PA-C 07/24/14 1549  Shaune Pollack, MD 07/24/14 1553

## 2014-07-24 NOTE — ED Notes (Signed)
Pt presents to ED with complaints of dental pain to left upper since this am

## 2014-10-10 ENCOUNTER — Encounter (HOSPITAL_BASED_OUTPATIENT_CLINIC_OR_DEPARTMENT_OTHER): Payer: Self-pay | Admitting: *Deleted

## 2014-10-10 ENCOUNTER — Emergency Department (HOSPITAL_BASED_OUTPATIENT_CLINIC_OR_DEPARTMENT_OTHER)
Admission: EM | Admit: 2014-10-10 | Discharge: 2014-10-10 | Disposition: A | Discharge status: Home / Self Care | Payer: Self-pay | Attending: Emergency Medicine | Admitting: Emergency Medicine

## 2014-10-10 ENCOUNTER — Emergency Department (HOSPITAL_BASED_OUTPATIENT_CLINIC_OR_DEPARTMENT_OTHER): Payer: Self-pay

## 2014-10-10 DIAGNOSIS — J449 Chronic obstructive pulmonary disease, unspecified: Secondary | ICD-10-CM | POA: Insufficient documentation

## 2014-10-10 DIAGNOSIS — Z720 Tobacco use: Secondary | ICD-10-CM | POA: Insufficient documentation

## 2014-10-10 DIAGNOSIS — Z87442 Personal history of urinary calculi: Secondary | ICD-10-CM | POA: Insufficient documentation

## 2014-10-10 DIAGNOSIS — R112 Nausea with vomiting, unspecified: Secondary | ICD-10-CM | POA: Insufficient documentation

## 2014-10-10 DIAGNOSIS — R109 Unspecified abdominal pain: Secondary | ICD-10-CM

## 2014-10-10 DIAGNOSIS — J309 Allergic rhinitis, unspecified: Secondary | ICD-10-CM | POA: Insufficient documentation

## 2014-10-10 DIAGNOSIS — K5900 Constipation, unspecified: Secondary | ICD-10-CM | POA: Insufficient documentation

## 2014-10-10 DIAGNOSIS — Z79899 Other long term (current) drug therapy: Secondary | ICD-10-CM | POA: Insufficient documentation

## 2014-10-10 DIAGNOSIS — I10 Essential (primary) hypertension: Secondary | ICD-10-CM | POA: Insufficient documentation

## 2014-10-10 DIAGNOSIS — Z792 Long term (current) use of antibiotics: Secondary | ICD-10-CM | POA: Insufficient documentation

## 2014-10-10 LAB — URINALYSIS, ROUTINE W REFLEX MICROSCOPIC
GLUCOSE, UA: NEGATIVE mg/dL
HGB URINE DIPSTICK: NEGATIVE
KETONES UR: 15 mg/dL — AB
Leukocytes, UA: NEGATIVE
Nitrite: NEGATIVE
PH: 6 (ref 5.0–8.0)
Protein, ur: NEGATIVE mg/dL
Specific Gravity, Urine: 1.025 (ref 1.005–1.030)
Urobilinogen, UA: 1 mg/dL (ref 0.0–1.0)

## 2014-10-10 LAB — CBC WITH DIFFERENTIAL/PLATELET
BASOS ABS: 0 10*3/uL (ref 0.0–0.1)
BASOS PCT: 0 % (ref 0–1)
Eosinophils Absolute: 0.1 10*3/uL (ref 0.0–0.7)
Eosinophils Relative: 1 % (ref 0–5)
HEMATOCRIT: 48 % (ref 39.0–52.0)
Hemoglobin: 16.2 g/dL (ref 13.0–17.0)
Lymphocytes Relative: 13 % (ref 12–46)
Lymphs Abs: 1.6 10*3/uL (ref 0.7–4.0)
MCH: 30.7 pg (ref 26.0–34.0)
MCHC: 33.8 g/dL (ref 30.0–36.0)
MCV: 90.9 fL (ref 78.0–100.0)
MONO ABS: 0.8 10*3/uL (ref 0.1–1.0)
Monocytes Relative: 7 % (ref 3–12)
Neutro Abs: 9.6 10*3/uL — ABNORMAL HIGH (ref 1.7–7.7)
Neutrophils Relative %: 79 % — ABNORMAL HIGH (ref 43–77)
Platelets: 271 10*3/uL (ref 150–400)
RBC: 5.28 MIL/uL (ref 4.22–5.81)
RDW: 13.5 % (ref 11.5–15.5)
WBC: 12.2 10*3/uL — ABNORMAL HIGH (ref 4.0–10.5)

## 2014-10-10 LAB — COMPREHENSIVE METABOLIC PANEL
ALK PHOS: 61 U/L (ref 39–117)
ALT: 8 U/L (ref 0–53)
AST: 15 U/L (ref 0–37)
Albumin: 4 g/dL (ref 3.5–5.2)
Anion gap: 10 (ref 5–15)
BUN: 14 mg/dL (ref 6–23)
CO2: 24 mmol/L (ref 19–32)
Calcium: 8.8 mg/dL (ref 8.4–10.5)
Chloride: 105 mmol/L (ref 96–112)
Creatinine, Ser: 0.86 mg/dL (ref 0.50–1.35)
GFR calc non Af Amer: >90 mL/min (ref >90)
GLUCOSE: 109 mg/dL — AB (ref 70–99)
Potassium: 3.7 mmol/L (ref 3.5–5.1)
Sodium: 139 mmol/L (ref 135–145)
Total Bilirubin: 0.3 mg/dL (ref 0.3–1.2)
Total Protein: 7.6 g/dL (ref 6.0–8.3)

## 2014-10-10 LAB — LIPASE, BLOOD: Lipase: 31 U/L (ref 11–59)

## 2014-10-10 MED ORDER — PROMETHAZINE HCL 25 MG PO TABS: 25.0000 mg | ORAL_TABLET | Freq: Four times a day (QID) | ORAL | Status: AC | PRN

## 2014-10-10 MED ORDER — FENTANYL CITRATE 0.05 MG/ML IJ SOLN
INTRAMUSCULAR | Status: AC
  Administered 2014-10-10: 50 ug via INTRAVENOUS
  Filled 2014-10-10: qty 2

## 2014-10-10 MED ORDER — SODIUM CHLORIDE 0.9 % IV SOLN: INTRAVENOUS | Status: DC

## 2014-10-10 MED ORDER — FENTANYL CITRATE 0.05 MG/ML IJ SOLN
50.0000 ug | Freq: Once | INTRAMUSCULAR | Status: AC
  Administered 2014-10-10: 50 ug via INTRAVENOUS
  Filled 2014-10-10: qty 2

## 2014-10-10 MED ORDER — ONDANSETRON HCL 4 MG/2ML IJ SOLN
4.0000 mg | Freq: Once | INTRAMUSCULAR | Status: AC
  Administered 2014-10-10: 4 mg via INTRAVENOUS
  Filled 2014-10-10: qty 2

## 2014-10-10 MED ORDER — SODIUM CHLORIDE 0.9 % IV BOLUS (SEPSIS)
1000.0000 mL | Freq: Once | INTRAVENOUS | Status: AC
  Administered 2014-10-10: 1000 mL via INTRAVENOUS

## 2014-10-10 MED ORDER — FENTANYL CITRATE 0.05 MG/ML IJ SOLN
50.0000 ug | Freq: Once | INTRAMUSCULAR | Status: AC
  Administered 2014-10-10: 50 ug via INTRAVENOUS

## 2014-10-10 MED ORDER — POLYETHYLENE GLYCOL 3350 17 G PO PACK: 17.0000 g | PACK | Freq: Every day | ORAL | Status: AC

## 2014-10-10 MED ORDER — BISACODYL 10 MG RE SUPP
20.0000 mg | Freq: Once | RECTAL | Status: AC
Start: 2014-10-10 — End: 2014-10-10
  Administered 2014-10-10: 20 mg via RECTAL
  Filled 2014-10-10: qty 2

## 2014-10-10 NOTE — ED Notes (Signed)
Pt given sprite for po challenge.

## 2014-10-10 NOTE — ED Provider Notes (Signed)
CSN: [id number]     Arrival date & time 10/10/14  1108 History   First MD Initiated Contact with Patient 10/10/14 1117     Chief Complaint  Patient presents with  . URI      HPI Patient presents with abdominal pain nausea and vomiting.  No diarrhea.  Bowel habits have been normal for him.  No hematemesis or hematochezia.  Patient has had allergic rhinitis over the last 3 days.  Denies fever or chills. Past Medical History  Diagnosis Date  . Spontaneous pneumothorax   . Kidney calculi   . COPD (chronic obstructive pulmonary disease)   . Hypertension    Past Surgical History  Procedure Laterality Date  . Cystoscopy    . Lithotripsy    . Lung surgery Left     pt states "cut out 2% of the bottom"  . Chest tube insertion     No family history on file. History  Substance Use Topics  . Smoking status: Current Every Day Smoker -- 1.00 packs/day    Types: Cigarettes  . Smokeless tobacco: Never Used  . Alcohol Use: No    Review of Systems  All other systems reviewed and are negative  Allergies  Review of patient's allergies indicates no known allergies.  Home Medications   Prior to Admission medications   Medication Sig Start Date End Date Taking? Authorizing Provider  amoxicillin (AMOXIL) 500 MG capsule Take 1 capsule (500 mg total) by mouth 3 (three) times daily. 07/24/14   Nicole Pisciotta, PA-C  hydrochlorothiazide (HYDRODIURIL) 25 MG tablet Take 25 mg by mouth daily.    Historical Provider, MD  HYDROcodone-acetaminophen (NORCO) 5-325 MG per tablet Take 2 tablets by mouth every 4 (four) hours as needed. 09/02/13   Veryl Speak, MD  lisinopril (PRINIVIL,ZESTRIL) 10 MG tablet Take 1 tablet (10 mg total) by mouth daily. 03/20/13   Antonietta Breach, PA-C  ondansetron (ZOFRAN ODT) 4 MG disintegrating tablet 4mg  ODT q4 hours prn nausea/vomit 10/16/13   Pamella Pert, MD  oseltamivir (TAMIFLU) 75 MG capsule Take 1 capsule (75 mg total) by mouth every 12 (twelve) hours. 10/16/13   Pamella Pert, MD  oxyCODONE-acetaminophen (PERCOCET/ROXICET) 5-325 MG per tablet Take 1 tablet by mouth every 4 (four) hours as needed for severe pain. 06/05/13   Charlann Lange, PA-C  oxyCODONE-acetaminophen (PERCOCET/ROXICET) 5-325 MG per tablet 1 to 2 tabs PO q6hrs  PRN for pain 07/24/14   Elmyra Ricks Pisciotta, PA-C  penicillin v potassium (VEETID) 500 MG tablet Take 1 tablet (500 mg total) by mouth 3 (three) times daily. 06/05/13   Charlann Lange, PA-C  polyethylene glycol (MIRALAX) packet Take 17 g by mouth daily. 10/10/14   Leonard Schwartz, MD  promethazine (PHENERGAN) 25 MG tablet Take 1 tablet (25 mg total) by mouth every 6 (six) hours as needed for nausea or vomiting. 10/10/14   Leonard Schwartz, MD   BP 132/89 mmHg  Pulse 80  Temp(Src) 98.6 F (37 C) (Oral)  Resp 18  Ht 5\' 7"  (1.702 m)  Wt 124 lb (56.246 kg)  BMI 19.42 kg/m2  SpO2 98% Physical Exam Physical Exam  Nursing note and vitals reviewed. Constitutional: He is oriented to person, place, and time. He appears well-developed and well-nourished. No distress.  HENT:  Head: Normocephalic and atraumatic.  Eyes: Pupils are equal, round, and reactive to light.  Neck: Normal range of motion.  No cervical or meningeal signs.  Cardiovascular: Normal rate and intact distal pulses.   Pulmonary/Chest: No respiratory distress.  Breath sounds equal to auscultation with no wheezes or rales.   Abdominal: Normal appearance. He exhibits no distension.  No localized tenderness to palpation.  Bowel sounds active in all quadrants.  No rebound or guarding tenderness.   Musculoskeletal: Normal range of motion.  Neurological: He is alert and oriented to person, place, and time. No cranial nerve deficit.  Skin: Skin is warm and dry. No rash noted.  Psychiatric: He has a normal mood and affect. His behavior is normal.   ED Course  Procedures (including critical care time) Medications  sodium chloride 0.9 % bolus 1,000 mL (0 mLs Intravenous Stopped 10/10/14  1417)  ondansetron (ZOFRAN) injection 4 mg (4 mg Intravenous Given 10/10/14 1139)  fentaNYL (SUBLIMAZE) injection 50 mcg (50 mcg Intravenous Given 10/10/14 1139)  fentaNYL (SUBLIMAZE) injection 50 mcg (50 mcg Intravenous Given 10/10/14 1257)  bisacodyl (DULCOLAX) suppository 20 mg (20 mg Rectal Given 10/10/14 1421)  fentaNYL (SUBLIMAZE) injection 50 mcg (50 mcg Intravenous Given 10/10/14 1510)    Labs Review Labs Reviewed  CBC WITH DIFFERENTIAL/PLATELET - Abnormal; Notable for the following:    WBC 12.2 (*)    Neutrophils Relative % 79 (*)    Neutro Abs 9.6 (*)    All other components within normal limits  COMPREHENSIVE METABOLIC PANEL - Abnormal; Notable for the following:    Glucose, Bld 109 (*)    All other components within normal limits  URINALYSIS, ROUTINE W REFLEX MICROSCOPIC - Abnormal; Notable for the following:    Bilirubin Urine SMALL (*)    Ketones, ur 15 (*)    All other components within normal limits  LIPASE, BLOOD    Imaging Review No results found.   After review of the urine I doubt this is pyelonephritis or nephrolithiasis.  Suspect either constipation and/or acute enteritis. I discussed with patient the options including admission.  He would prefer to try a laxative and see if he improves.  If he has a bowel movement and has decrease in pain and unable to hold down liquids he most likely to go home.  If not he'll need admission for IV fluids until symptoms resolve. MDM   Final diagnoses:  Nausea and vomiting  Abdominal pain  Constipation, unspecified constipation type        Leonard Schwartz, MD 10/15/14 1544

## 2014-10-10 NOTE — ED Provider Notes (Signed)
More painPatient had a large bowel movement and said his cramping was decreased and he felt better one to go home.  I gave instructions on increasing dietary fiber and told him signs to return if he or increased vomiting.  Leonard Schwartz, MD 10/10/14 [phone]

## 2014-10-10 NOTE — Discharge Instructions (Signed)

## 2014-10-10 NOTE — ED Notes (Signed)
Pt assisted to call for his ride. Pt getting dressed.

## 2014-10-10 NOTE — ED Notes (Signed)
Cold symptoms with runny nose, cough, fever, body aches and headache for x 3 days. Woke this am with neck and back pain. Abdominal cramps.

## 2019-01-13 DEATH — deceased
